# Patient Record
Sex: Female | Born: 1945 | Race: White | Hispanic: No | Marital: Married | Smoking: Never smoker
Health system: Southern US, Community
[De-identification: ages and names within clinical notes are randomized; demographics above are authoritative.]

## PROBLEM LIST (undated history)

## (undated) DIAGNOSIS — M545 Low back pain: Secondary | ICD-10-CM

## (undated) DIAGNOSIS — M519 Unspecified thoracic, thoracolumbar and lumbosacral intervertebral disc disorder: Secondary | ICD-10-CM

## (undated) DIAGNOSIS — S83209A Unspecified tear of unspecified meniscus, current injury, unspecified knee, initial encounter: Secondary | ICD-10-CM

## (undated) DIAGNOSIS — G629 Polyneuropathy, unspecified: Secondary | ICD-10-CM

## (undated) DIAGNOSIS — M5090 Cervical disc disorder, unspecified, unspecified cervical region: Secondary | ICD-10-CM

## (undated) DIAGNOSIS — M23209 Derangement of unspecified meniscus due to old tear or injury, unspecified knee: Secondary | ICD-10-CM

## (undated) DIAGNOSIS — I10 Essential (primary) hypertension: Secondary | ICD-10-CM

## (undated) DIAGNOSIS — G4700 Insomnia, unspecified: Secondary | ICD-10-CM

## (undated) DIAGNOSIS — E039 Hypothyroidism, unspecified: Secondary | ICD-10-CM

## (undated) DIAGNOSIS — G8929 Other chronic pain: Secondary | ICD-10-CM

## (undated) DIAGNOSIS — T7840XA Allergy, unspecified, initial encounter: Secondary | ICD-10-CM

## (undated) DIAGNOSIS — K921 Melena: Secondary | ICD-10-CM

## (undated) DIAGNOSIS — J984 Other disorders of lung: Secondary | ICD-10-CM

## (undated) DIAGNOSIS — M19071 Primary osteoarthritis, right ankle and foot: Secondary | ICD-10-CM

## (undated) DIAGNOSIS — K589 Irritable bowel syndrome without diarrhea: Secondary | ICD-10-CM

## (undated) DIAGNOSIS — E785 Hyperlipidemia, unspecified: Secondary | ICD-10-CM

## (undated) DIAGNOSIS — R7302 Impaired glucose tolerance (oral): Secondary | ICD-10-CM

## (undated) HISTORY — DX: Allergy, unspecified, initial encounter: T78.40XA

## (undated) HISTORY — DX: Insomnia, unspecified: G47.00

## (undated) HISTORY — DX: Hypothyroidism, unspecified: E03.9

## (undated) HISTORY — DX: Other disorders of lung: J98.4

## (undated) HISTORY — PX: HEMORRHOID SURGERY: SHX153

## (undated) HISTORY — DX: Unspecified thoracic, thoracolumbar and lumbosacral intervertebral disc disorder: M51.9

## (undated) HISTORY — DX: Essential (primary) hypertension: I10

## (undated) HISTORY — PX: CERVICAL LAMINECTOMY: SHX94

## (undated) HISTORY — DX: Derangement of unspecified meniscus due to old tear or injury, unspecified knee: M23.209

## (undated) HISTORY — DX: Unspecified tear of unspecified meniscus, current injury, unspecified knee, initial encounter: S83.209A

## (undated) HISTORY — DX: Primary osteoarthritis, right ankle and foot: M19.071

## (undated) HISTORY — DX: Irritable bowel syndrome without diarrhea: K58.9

## (undated) HISTORY — DX: Cervical disc disorder, unspecified, unspecified cervical region: M50.90

## (undated) HISTORY — DX: Hyperlipidemia, unspecified: E78.5

## (undated) HISTORY — DX: Other chronic pain: G89.29

## (undated) HISTORY — PX: CARPAL TUNNEL RELEASE: SHX101

## (undated) HISTORY — DX: Melena: K92.1

## (undated) HISTORY — DX: Polyneuropathy, unspecified: G62.9

## (undated) HISTORY — DX: Impaired glucose tolerance (oral): R73.02

## (undated) HISTORY — DX: Low back pain: M54.5

## (undated) HISTORY — PX: LUMBAR FUSION: SHX111

---
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
## 1965-05-07 HISTORY — PX: TONSILLECTOMY: SUR1361

## 1965-05-07 HISTORY — PX: CHOLECYSTECTOMY: SHX55

## 1972-05-07 HISTORY — PX: ABDOMINAL HYSTERECTOMY: SHX81

## 1997-08-12 ENCOUNTER — Inpatient Hospital Stay (HOSPITAL_COMMUNITY): Admission: RE | Admit: 1997-08-12 | Discharge: 1997-08-14 | Payer: Self-pay | Admitting: Neurological Surgery

## 1997-11-16 ENCOUNTER — Encounter: Admission: RE | Admit: 1997-11-16 | Discharge: 1998-02-14 | Payer: Self-pay | Admitting: Neurological Surgery

## 1998-05-04 ENCOUNTER — Ambulatory Visit (HOSPITAL_COMMUNITY): Admission: RE | Admit: 1998-05-04 | Discharge: 1998-05-04 | Payer: Self-pay | Admitting: Neurological Surgery

## 1998-05-04 ENCOUNTER — Encounter: Payer: Self-pay | Admitting: Neurological Surgery

## 1998-06-23 ENCOUNTER — Encounter: Admission: RE | Admit: 1998-06-23 | Discharge: 1998-08-09 | Payer: Self-pay | Admitting: Anesthesiology

## 1998-07-05 ENCOUNTER — Other Ambulatory Visit: Admission: RE | Admit: 1998-07-05 | Discharge: 1998-07-05 | Payer: Self-pay | Admitting: Gynecology

## 1998-08-03 ENCOUNTER — Encounter: Payer: Self-pay | Admitting: Neurological Surgery

## 1998-08-03 ENCOUNTER — Ambulatory Visit (HOSPITAL_COMMUNITY): Admission: RE | Admit: 1998-08-03 | Discharge: 1998-08-03 | Payer: Self-pay | Admitting: Neurological Surgery

## 1999-06-08 ENCOUNTER — Encounter: Payer: Self-pay | Admitting: Gynecology

## 1999-06-08 ENCOUNTER — Encounter: Admission: RE | Admit: 1999-06-08 | Discharge: 1999-06-08 | Payer: Self-pay | Admitting: Gynecology

## 1999-06-22 ENCOUNTER — Other Ambulatory Visit: Admission: RE | Admit: 1999-06-22 | Discharge: 1999-06-22 | Payer: Self-pay | Admitting: Gynecology

## 1999-07-06 ENCOUNTER — Encounter: Admission: RE | Admit: 1999-07-06 | Discharge: 1999-07-06 | Payer: Self-pay | Admitting: Gynecology

## 1999-07-06 ENCOUNTER — Encounter: Payer: Self-pay | Admitting: Gynecology

## 2000-06-27 ENCOUNTER — Encounter: Admission: RE | Admit: 2000-06-27 | Discharge: 2000-06-27 | Payer: Self-pay | Admitting: Gynecology

## 2000-06-27 ENCOUNTER — Encounter: Payer: Self-pay | Admitting: Gynecology

## 2000-07-04 ENCOUNTER — Other Ambulatory Visit: Admission: RE | Admit: 2000-07-04 | Discharge: 2000-07-04 | Payer: Self-pay | Admitting: Gynecology

## 2001-07-02 ENCOUNTER — Ambulatory Visit (HOSPITAL_COMMUNITY): Admission: RE | Admit: 2001-07-02 | Discharge: 2001-07-02 | Payer: Self-pay | Admitting: Emergency Medicine

## 2001-07-04 ENCOUNTER — Encounter: Admission: RE | Admit: 2001-07-04 | Discharge: 2001-07-04 | Payer: Self-pay | Admitting: Gynecology

## 2001-07-04 ENCOUNTER — Encounter: Payer: Self-pay | Admitting: Gynecology

## 2001-07-17 ENCOUNTER — Other Ambulatory Visit: Admission: RE | Admit: 2001-07-17 | Discharge: 2001-07-17 | Payer: Self-pay | Admitting: Gynecology

## 2001-09-04 ENCOUNTER — Ambulatory Visit (HOSPITAL_COMMUNITY): Admission: RE | Admit: 2001-09-04 | Discharge: 2001-09-04 | Payer: Self-pay | Admitting: Gastroenterology

## 2002-07-08 ENCOUNTER — Encounter: Payer: Self-pay | Admitting: Gynecology

## 2002-07-08 ENCOUNTER — Encounter: Admission: RE | Admit: 2002-07-08 | Discharge: 2002-07-08 | Payer: Self-pay | Admitting: Gynecology

## 2002-07-30 ENCOUNTER — Other Ambulatory Visit: Admission: RE | Admit: 2002-07-30 | Discharge: 2002-07-30 | Payer: Self-pay | Admitting: Gynecology

## 2002-07-31 ENCOUNTER — Ambulatory Visit (HOSPITAL_BASED_OUTPATIENT_CLINIC_OR_DEPARTMENT_OTHER): Admission: RE | Admit: 2002-07-31 | Discharge: 2002-07-31 | Payer: Self-pay | Admitting: Rheumatology

## 2003-07-09 ENCOUNTER — Encounter: Admission: RE | Admit: 2003-07-09 | Discharge: 2003-07-09 | Payer: Self-pay | Admitting: Gynecology

## 2003-08-05 ENCOUNTER — Other Ambulatory Visit: Admission: RE | Admit: 2003-08-05 | Discharge: 2003-08-05 | Payer: Self-pay | Admitting: Gynecology

## 2004-07-14 ENCOUNTER — Encounter: Admission: RE | Admit: 2004-07-14 | Discharge: 2004-07-14 | Payer: Self-pay | Admitting: Gynecology

## 2004-07-28 ENCOUNTER — Encounter: Admission: RE | Admit: 2004-07-28 | Discharge: 2004-07-28 | Payer: Self-pay | Admitting: Emergency Medicine

## 2004-08-08 ENCOUNTER — Encounter: Admission: RE | Admit: 2004-08-08 | Discharge: 2004-08-08 | Payer: Self-pay | Admitting: Emergency Medicine

## 2004-08-10 ENCOUNTER — Other Ambulatory Visit: Admission: RE | Admit: 2004-08-10 | Discharge: 2004-08-10 | Payer: Self-pay | Admitting: Gynecology

## 2005-07-19 ENCOUNTER — Encounter: Admission: RE | Admit: 2005-07-19 | Discharge: 2005-07-19 | Payer: Self-pay | Admitting: Gynecology

## 2006-07-25 ENCOUNTER — Encounter: Admission: RE | Admit: 2006-07-25 | Discharge: 2006-07-25 | Payer: Self-pay | Admitting: Gynecology

## 2006-08-05 ENCOUNTER — Encounter: Admission: RE | Admit: 2006-08-05 | Discharge: 2006-08-05 | Payer: Self-pay | Admitting: Emergency Medicine

## 2006-08-22 ENCOUNTER — Other Ambulatory Visit: Admission: RE | Admit: 2006-08-22 | Discharge: 2006-08-22 | Payer: Self-pay | Admitting: Gynecology

## 2007-05-13 ENCOUNTER — Ambulatory Visit (HOSPITAL_COMMUNITY): Admission: RE | Admit: 2007-05-13 | Discharge: 2007-05-13 | Payer: Self-pay | Admitting: Emergency Medicine

## 2007-05-13 ENCOUNTER — Encounter (INDEPENDENT_AMBULATORY_CARE_PROVIDER_SITE_OTHER): Payer: Self-pay | Admitting: Emergency Medicine

## 2007-08-14 ENCOUNTER — Encounter: Admission: RE | Admit: 2007-08-14 | Discharge: 2007-08-14 | Payer: Self-pay | Admitting: Gynecology

## 2008-08-18 ENCOUNTER — Encounter: Admission: RE | Admit: 2008-08-18 | Discharge: 2008-08-18 | Payer: Self-pay | Admitting: Gynecology

## 2009-05-03 ENCOUNTER — Encounter: Admission: RE | Admit: 2009-05-03 | Discharge: 2009-05-03 | Payer: Self-pay

## 2009-08-25 ENCOUNTER — Encounter: Admission: RE | Admit: 2009-08-25 | Discharge: 2009-08-25 | Payer: Self-pay | Admitting: Gynecology

## 2010-08-21 ENCOUNTER — Other Ambulatory Visit: Payer: Self-pay | Admitting: Gynecology

## 2010-08-21 DIAGNOSIS — Z1231 Encounter for screening mammogram for malignant neoplasm of breast: Secondary | ICD-10-CM

## 2010-08-29 ENCOUNTER — Ambulatory Visit
Admission: RE | Admit: 2010-08-29 | Discharge: 2010-08-29 | Disposition: A | Discharge status: Home / Self Care | Payer: Medicare Other | Source: Ambulatory Visit | Attending: Gynecology | Admitting: Gynecology

## 2010-08-29 DIAGNOSIS — Z1231 Encounter for screening mammogram for malignant neoplasm of breast: Secondary | ICD-10-CM

## 2010-09-22 NOTE — Procedures (Signed)
[place]  Patient:    Sheryl Copeland, Sheryl Copeland Visit Number: [id number] MRN: [id number]          Service Type: END Location: ENDO Attending Physician:  Charna Elizabeth Dictated by:   Anselmo Rod, M.D. Proc. Date: 09/04/01 Admit Date:  09/04/2001   CC:         Reuben Likes, M.D.   Procedure Report  DATE OF BIRTH:  08-19-2045  PROCEDURE PERFORMED:  Colonoscopy.  ENDOSCOPIST:  Anselmo Rod, M.D.  INSTRUMENT USED:  Olympus videocolonoscope.  INDICATIONS FOR PROCEDURE:  A 65 year old white female with a history of rectal bleeding, rule out colonic polyps, masses, hemorrhoids, etc.  PREPROCEDURE PREPARATION:  Informed consent was procured from the patient. The patient was fasted for eight hours prior to the procedure, and prepped with a bottle of magnesium of citrate and a gallon of NuLytely the night prior to the procedure.  PREPROCEDURE PHYSICAL:  VITAL SIGNS:  The patient had stable vital signs.  NECK:  Supple.  CHEST:  Clear to auscultation.  CARDIAC:  S1 and S2 is regular.  ABDOMEN:  Soft with normal bowel sounds.  DESCRIPTION OF THE PROCEDURE:   The patient was placed in the left lateral decubitus position and sedated with 75 mg of Demerol and 7 mg of Versed intravenously.  Once the patient was adequately sedated and maintained on low-flow oxygen and continuous cardiac monitoring, the Olympus videocolonoscope was advanced from the rectum to the cecum with difficulty. There was a large amount of residual stool in the colon.  Small internal hemorrhoids were appreciated on retroflexion in the rectum.  Multiple washes were done.  No masses, polyps, or diverticula were seen.  Small lesions could have been missed.  IMPRESSION: 1. Large amount of residual stool in the colon.  No masses, polyps, or    diverticula seen. 2. Small tablets seen in the cecum.  RECOMMENDATIONS: 1. High-fiber has been advised for the  patient. 2. Outpatient followup is advised in the next two weeks, further    recommendation to be made at that time. Dictated by:   Anselmo Rod, M.D. Attending Physician:  Charna Elizabeth DD:  09/04/01 TD:  09/06/01 Job: [id number]

## 2010-10-06 LAB — PULMONARY FUNCTION TEST

## 2011-01-06 DIAGNOSIS — S83209A Unspecified tear of unspecified meniscus, current injury, unspecified knee, initial encounter: Secondary | ICD-10-CM

## 2011-01-06 HISTORY — DX: Unspecified tear of unspecified meniscus, current injury, unspecified knee, initial encounter: S83.209A

## 2011-01-10 ENCOUNTER — Other Ambulatory Visit: Payer: Self-pay | Admitting: Emergency Medicine

## 2011-01-10 ENCOUNTER — Ambulatory Visit
Admission: RE | Admit: 2011-01-10 | Discharge: 2011-01-10 | Disposition: A | Discharge status: Home / Self Care | Payer: Medicare Other | Source: Ambulatory Visit | Attending: Emergency Medicine | Admitting: Emergency Medicine

## 2011-01-10 DIAGNOSIS — M25569 Pain in unspecified knee: Secondary | ICD-10-CM

## 2011-01-11 ENCOUNTER — Other Ambulatory Visit: Payer: Self-pay | Admitting: Emergency Medicine

## 2011-01-11 DIAGNOSIS — M25561 Pain in right knee: Secondary | ICD-10-CM

## 2011-01-13 ENCOUNTER — Ambulatory Visit
Admission: RE | Admit: 2011-01-13 | Discharge: 2011-01-13 | Disposition: A | Discharge status: Home / Self Care | Payer: Medicare Other | Source: Ambulatory Visit | Attending: Emergency Medicine | Admitting: Emergency Medicine

## 2011-01-13 DIAGNOSIS — M25561 Pain in right knee: Secondary | ICD-10-CM

## 2011-06-30 ENCOUNTER — Encounter: Payer: Self-pay | Admitting: Internal Medicine

## 2011-06-30 DIAGNOSIS — Z0001 Encounter for general adult medical examination with abnormal findings: Secondary | ICD-10-CM | POA: Insufficient documentation

## 2011-07-03 ENCOUNTER — Encounter: Payer: Self-pay | Admitting: Internal Medicine

## 2011-07-03 ENCOUNTER — Ambulatory Visit (INDEPENDENT_AMBULATORY_CARE_PROVIDER_SITE_OTHER): Payer: Medicare Other | Admitting: Internal Medicine

## 2011-07-03 ENCOUNTER — Other Ambulatory Visit (INDEPENDENT_AMBULATORY_CARE_PROVIDER_SITE_OTHER): Payer: Medicare Other

## 2011-07-03 VITALS — BP 132/80 | HR 56 | Temp 97.5°F | Ht 63.0 in | Wt 227.5 lb

## 2011-07-03 DIAGNOSIS — I10 Essential (primary) hypertension: Secondary | ICD-10-CM | POA: Insufficient documentation

## 2011-07-03 DIAGNOSIS — R7302 Impaired glucose tolerance (oral): Secondary | ICD-10-CM

## 2011-07-03 DIAGNOSIS — R7309 Other abnormal glucose: Secondary | ICD-10-CM

## 2011-07-03 DIAGNOSIS — M519 Unspecified thoracic, thoracolumbar and lumbosacral intervertebral disc disorder: Secondary | ICD-10-CM | POA: Insufficient documentation

## 2011-07-03 DIAGNOSIS — E785 Hyperlipidemia, unspecified: Secondary | ICD-10-CM

## 2011-07-03 DIAGNOSIS — Z0000 Encounter for general adult medical examination without abnormal findings: Secondary | ICD-10-CM

## 2011-07-03 DIAGNOSIS — E039 Hypothyroidism, unspecified: Secondary | ICD-10-CM | POA: Insufficient documentation

## 2011-07-03 DIAGNOSIS — K589 Irritable bowel syndrome without diarrhea: Secondary | ICD-10-CM

## 2011-07-03 DIAGNOSIS — S91102A Unspecified open wound of left great toe without damage to nail, initial encounter: Secondary | ICD-10-CM | POA: Insufficient documentation

## 2011-07-03 DIAGNOSIS — J4520 Mild intermittent asthma, uncomplicated: Secondary | ICD-10-CM | POA: Insufficient documentation

## 2011-07-03 DIAGNOSIS — R7303 Prediabetes: Secondary | ICD-10-CM | POA: Insufficient documentation

## 2011-07-03 DIAGNOSIS — G629 Polyneuropathy, unspecified: Secondary | ICD-10-CM | POA: Insufficient documentation

## 2011-07-03 DIAGNOSIS — T7840XA Allergy, unspecified, initial encounter: Secondary | ICD-10-CM | POA: Insufficient documentation

## 2011-07-03 DIAGNOSIS — M5090 Cervical disc disorder, unspecified, unspecified cervical region: Secondary | ICD-10-CM | POA: Insufficient documentation

## 2011-07-03 HISTORY — DX: Polyneuropathy, unspecified: G62.9

## 2011-07-03 HISTORY — DX: Irritable bowel syndrome, unspecified: K58.9

## 2011-07-03 HISTORY — DX: Impaired glucose tolerance (oral): R73.02

## 2011-07-03 LAB — HEMOGLOBIN A1C: Hgb A1c MFr Bld: 5.4 % (ref 4.6–6.5)

## 2011-07-03 LAB — CBC WITH DIFFERENTIAL/PLATELET
Eosinophils Relative: 6.4 % — ABNORMAL HIGH (ref 0.0–5.0)
Lymphocytes Relative: 29.4 % (ref 12.0–46.0)
Monocytes Absolute: 0.8 10*3/uL (ref 0.1–1.0)
Monocytes Relative: 9.8 % (ref 3.0–12.0)
Neutrophils Relative %: 53.8 % (ref 43.0–77.0)
Platelets: 187 10*3/uL (ref 150.0–400.0)
WBC: 8 10*3/uL (ref 4.5–10.5)

## 2011-07-03 LAB — URINALYSIS, ROUTINE W REFLEX MICROSCOPIC
Bilirubin Urine: NEGATIVE
Leukocytes, UA: NEGATIVE
Nitrite: NEGATIVE
Specific Gravity, Urine: 1.02 (ref 1.000–1.030)
pH: 6 (ref 5.0–8.0)

## 2011-07-03 LAB — BASIC METABOLIC PANEL
BUN: 18 mg/dL (ref 6–23)
Chloride: 101 mEq/L (ref 96–112)
Creatinine, Ser: 0.8 mg/dL (ref 0.4–1.2)
Glucose, Bld: 93 mg/dL (ref 70–99)
Potassium: 3.6 mEq/L (ref 3.5–5.1)

## 2011-07-03 LAB — HEPATIC FUNCTION PANEL
ALT: 22 U/L (ref 0–35)
AST: 26 U/L (ref 0–37)
Albumin: 4 g/dL (ref 3.5–5.2)
Total Protein: 7.2 g/dL (ref 6.0–8.3)

## 2011-07-03 LAB — LIPID PANEL
Cholesterol: 152 mg/dL (ref 0–200)
Triglycerides: 135 mg/dL (ref 0.0–149.0)

## 2011-07-03 LAB — TSH: TSH: 1.32 u[IU]/mL (ref 0.35–5.50)

## 2011-07-03 MED ORDER — POTASSIUM CHLORIDE ER 10 MEQ PO CPCR: 10.0000 meq | ORAL_CAPSULE | Freq: Every day | ORAL | Status: DC

## 2011-07-03 MED ORDER — NABUMETONE 500 MG PO TABS: 500.0000 mg | ORAL_TABLET | Freq: Two times a day (BID) | ORAL | Status: DC

## 2011-07-03 MED ORDER — ATENOLOL 50 MG PO TABS: 50.0000 mg | ORAL_TABLET | Freq: Every day | ORAL | Status: DC

## 2011-07-03 MED ORDER — QUINAPRIL HCL 40 MG PO TABS: 40.0000 mg | ORAL_TABLET | Freq: Every day | ORAL | Status: DC

## 2011-07-03 MED ORDER — INDAPAMIDE 2.5 MG PO TABS: 2.5000 mg | ORAL_TABLET | Freq: Every day | ORAL | Status: DC

## 2011-07-03 MED ORDER — TRAMADOL HCL 50 MG PO TABS: 50.0000 mg | ORAL_TABLET | Freq: Three times a day (TID) | ORAL | Status: DC | PRN

## 2011-07-03 MED ORDER — HYOSCYAMINE SULFATE CR 0.375 MG PO CP12: 0.3750 mg | ORAL_CAPSULE | Freq: Two times a day (BID) | ORAL | Status: DC | PRN

## 2011-07-03 NOTE — Assessment & Plan Note (Signed)
Overall doing well, age appropriate education and counseling updated, referrals for preventative services and immunizations addressed, dietary and smoking counseling addressed, most recent labs and ECG reviewed.  I have personally reviewed and have noted: 1) the patient's medical and social history 2) The pt's use of alcohol, tobacco, and illicit drugs 3) The patient's current medications and supplements 4) Functional ability including ADL's, fall risk, home safety risk, hearing and visual impairment 5) Diet and physical activities 6) Evidence for depression or mood disorder 7) The patient's height, weight, and BMI have been recorded in the chart I have made referrals, and provided counseling and education based on review of the above Declines colonscopy, immunization at this time; for labs

## 2011-07-03 NOTE — Patient Instructions (Addendum)
Continue all other medications as before You are given all the refills hardcopy today Please call if you change your mind about the colonoscopy referral, orthopedic (Dr Victorino Dike) for the toe wound Please go to LAB in the Basement for the blood and/or urine tests to be done today Please call the phone number [phone] (the PhoneTree System) for results of testing in 2-3 days;  When calling, simply dial the number, and when prompted enter the MRN number above (the Medical Record Number) and the # key, then the message should start. Please return in 1 year for your yearly visit, or sooner if needed, with Lab testing done 3-5 days before

## 2011-07-08 ENCOUNTER — Encounter: Payer: Self-pay | Admitting: Internal Medicine

## 2011-07-08 NOTE — Assessment & Plan Note (Signed)
stable overall by hx and exam, most recent data reviewed with pt, and pt to continue medical treatment as before, for a1c Lab Results  Component Value Date   HGBA1C 5.4 07/03/2011

## 2011-07-08 NOTE — Progress Notes (Signed)
Subjective:    Patient ID: Sheryl Copeland, female    DOB: 06-03-1945, 66 y.o.   MRN: [id number]  HPI  Here for wellness as new pt;  Overall doing ok;  Pt denies CP, worsening SOB, DOE, wheezing, orthopnea, PND, worsening LE edema, palpitations, dizziness or syncope.  Pt denies neurological change such as new Headache, facial or extremity weakness.  Pt denies polydipsia, polyuria, or low sugar symptoms. Pt states overall good compliance with treatment and medications, good tolerability, and trying to follow lower cholesterol diet.  Pt denies worsening depressive symptoms, suicidal ideation or panic. No fever, wt loss, night sweats, loss of appetite, or other constitutional symptoms.  Pt states good ability with ADL's, low fall risk, home safety reviewed and adequate, no significant changes in hearing or vision, and occasionally active with exercise. Needs med refills soon. No willing to consider colonoscopy or toe wound f/u referrals at this time Past Medical History  Diagnosis Date  . Acute meniscal tear of knee sept 2012    right knee  . Arthritis of foot, right     first mtp  . Blood in stool   . Migraine   . Cervical disc disease     s/p cervical spine surgury 1990  . Lumbar disc disease     s/p lumbar fusion 1999  . Allergy   . Asthma   . Hypertension   . Hypothyroidism   . IBS (irritable bowel syndrome) 07/03/2011  . Peripheral neuropathy 07/03/2011  . Impaired glucose tolerance 07/03/2011   Past Surgical History  Procedure Date  . Lumbar fusion   . Cholecystectomy 1967  . Appendectomy 1962  . Tonsillectomy 1967  . Abdominal hysterectomy 1974    reports that she has never smoked. She does not have any smokeless tobacco history on file. She reports that she does not drink alcohol or use illicit drugs. family history includes Alcohol abuse in her others; Arthritis in her others; Cancer in her others; Diabetes in her other; Heart disease in her other; Hyperlipidemia in her  other; Hypertension in her others; Mental illness in her other; and Stroke in her other. Allergies  Allergen Reactions  . Celebrex (Celecoxib)   . Iodine    No current outpatient prescriptions on file prior to visit.   Review of Systems Review of Systems  Constitutional: Negative for diaphoresis, activity change, appetite change and unexpected weight change.  HENT: Negative for hearing loss, ear pain, facial swelling, mouth sores and neck stiffness.   Eyes: Negative for pain, redness and visual disturbance.  Respiratory: Negative for shortness of breath and wheezing.   Cardiovascular: Negative for chest pain and palpitations.  Gastrointestinal: Negative for diarrhea, blood in stool, abdominal distention and rectal pain.  Genitourinary: Negative for hematuria, flank pain and decreased urine volume.  Musculoskeletal: Negative for myalgias and joint swelling.  Skin: Negative for color change and wound.  Neurological: Negative for syncope and numbness.  Hematological: Negative for adenopathy.  Psychiatric/Behavioral: Negative for hallucinations, self-injury, decreased concentration and agitation.      Objective:   Physical Exam BP 132/80  Pulse 56  Temp(Src) 97.5 F (36.4 C) (Oral)  Ht 5\' 3"  (1.6 m)  Wt 227 lb 8 oz (103.193 kg)  BMI 40.30 kg/m2  SpO2 95% Physical Exam  VS noted Constitutional: Pt is oriented to person, place, and time. Appears well-developed and well-nourished.  HENT:  Head: Normocephalic and atraumatic.  Right Ear: External ear normal.  Left Ear: External ear normal.  Nose: Nose normal.  Mouth/Throat: Oropharynx is clear and moist.  Eyes: Conjunctivae and EOM are normal. Pupils are equal, round, and reactive to light.  Neck: Normal range of motion. Neck supple. No JVD present. No tracheal deviation present.  Cardiovascular: Normal rate, regular rhythm, normal heart sounds and intact distal pulses.   Pulmonary/Chest: Effort normal and breath sounds normal.    Abdominal: Soft. Bowel sounds are normal. There is no tenderness.  Musculoskeletal: Normal range of motion. Exhibits no edema.  Lymphadenopathy:  Has no cervical adenopathy.  Neurological: Pt is alert and oriented to person, place, and time. Pt has normal reflexes. No cranial nerve deficit.  Skin: Skin is warm and dry. No rash noted. left toe wound not examined today Psychiatric:  Has  normal mood and affect. Behavior is normal.     Assessment & Plan:

## 2011-07-15 ENCOUNTER — Encounter: Payer: Self-pay | Admitting: Internal Medicine

## 2011-07-15 DIAGNOSIS — J984 Other disorders of lung: Secondary | ICD-10-CM | POA: Insufficient documentation

## 2011-07-15 DIAGNOSIS — M545 Low back pain, unspecified: Secondary | ICD-10-CM | POA: Insufficient documentation

## 2011-07-15 DIAGNOSIS — M23209 Derangement of unspecified meniscus due to old tear or injury, unspecified knee: Secondary | ICD-10-CM

## 2011-07-15 DIAGNOSIS — G43909 Migraine, unspecified, not intractable, without status migrainosus: Secondary | ICD-10-CM | POA: Insufficient documentation

## 2011-07-15 DIAGNOSIS — G4700 Insomnia, unspecified: Secondary | ICD-10-CM

## 2011-07-15 DIAGNOSIS — G8929 Other chronic pain: Secondary | ICD-10-CM

## 2011-07-15 DIAGNOSIS — E785 Hyperlipidemia, unspecified: Secondary | ICD-10-CM

## 2011-07-15 HISTORY — DX: Hyperlipidemia, unspecified: E78.5

## 2011-07-15 HISTORY — DX: Other disorders of lung: J98.4

## 2011-07-15 HISTORY — DX: Derangement of unspecified meniscus due to old tear or injury, unspecified knee: M23.209

## 2011-07-15 HISTORY — DX: Insomnia, unspecified: G47.00

## 2011-07-15 HISTORY — DX: Low back pain, unspecified: M54.50

## 2011-09-21 ENCOUNTER — Other Ambulatory Visit: Payer: Self-pay | Admitting: Internal Medicine

## 2011-09-21 NOTE — Telephone Encounter (Signed)
Routine to robin 

## 2011-09-21 NOTE — Telephone Encounter (Signed)
Pt requesting thyroid med prescription/armour thyroid--[facility] [place]

## 2011-09-21 NOTE — Telephone Encounter (Signed)
Called the patient left message to call back 

## 2011-09-21 NOTE — Telephone Encounter (Signed)
Ok to refill, but I cannot find on the current MAR or in centricity as well  Please ask pt current dose, or contact pharmacy

## 2011-09-21 NOTE — Telephone Encounter (Signed)
Medication is not on list please advise on strength

## 2011-09-24 NOTE — Telephone Encounter (Signed)
Called the patient and she informed she is taking 120 mg of armour thyroid, will send to pharmacy

## 2011-09-26 ENCOUNTER — Other Ambulatory Visit: Payer: Self-pay | Admitting: Gynecology

## 2011-09-26 DIAGNOSIS — Z1231 Encounter for screening mammogram for malignant neoplasm of breast: Secondary | ICD-10-CM

## 2011-09-26 MED ORDER — THYROID 120 MG PO TABS: 120.0000 mg | ORAL_TABLET | Freq: Every day | ORAL | Status: DC

## 2011-10-04 ENCOUNTER — Ambulatory Visit
Admission: RE | Admit: 2011-10-04 | Discharge: 2011-10-04 | Disposition: A | Discharge status: Home / Self Care | Payer: Medicare Other | Source: Ambulatory Visit | Attending: Gynecology | Admitting: Gynecology

## 2011-10-04 DIAGNOSIS — Z1231 Encounter for screening mammogram for malignant neoplasm of breast: Secondary | ICD-10-CM

## 2012-05-07 HISTORY — PX: BREAST BIOPSY: SHX20

## 2012-07-03 ENCOUNTER — Ambulatory Visit (INDEPENDENT_AMBULATORY_CARE_PROVIDER_SITE_OTHER): Payer: Medicare Other | Admitting: Internal Medicine

## 2012-07-03 ENCOUNTER — Encounter: Payer: Self-pay | Admitting: Internal Medicine

## 2012-07-03 ENCOUNTER — Other Ambulatory Visit (INDEPENDENT_AMBULATORY_CARE_PROVIDER_SITE_OTHER): Payer: Medicare Other

## 2012-07-03 VITALS — BP 102/68 | HR 72 | Temp 98.0°F | Ht 63.0 in | Wt 214.5 lb

## 2012-07-03 DIAGNOSIS — Z0000 Encounter for general adult medical examination without abnormal findings: Secondary | ICD-10-CM

## 2012-07-03 DIAGNOSIS — J019 Acute sinusitis, unspecified: Secondary | ICD-10-CM

## 2012-07-03 DIAGNOSIS — R7302 Impaired glucose tolerance (oral): Secondary | ICD-10-CM

## 2012-07-03 DIAGNOSIS — Z23 Encounter for immunization: Secondary | ICD-10-CM

## 2012-07-03 DIAGNOSIS — R7309 Other abnormal glucose: Secondary | ICD-10-CM

## 2012-07-03 LAB — CBC WITH DIFFERENTIAL/PLATELET
Basophils Absolute: 0.1 10*3/uL (ref 0.0–0.1)
Eosinophils Absolute: 0.3 10*3/uL (ref 0.0–0.7)
HCT: 39.6 % (ref 36.0–46.0)
Hemoglobin: 13.7 g/dL (ref 12.0–15.0)
Lymphs Abs: 2.1 10*3/uL (ref 0.7–4.0)
MCHC: 34.6 g/dL (ref 30.0–36.0)
MCV: 89.5 fl (ref 78.0–100.0)
Neutro Abs: 7.2 10*3/uL (ref 1.4–7.7)
RDW: 12.3 % (ref 11.5–14.6)

## 2012-07-03 LAB — LIPID PANEL
HDL: 32.4 mg/dL — ABNORMAL LOW (ref >39.00)
Total CHOL/HDL Ratio: 4

## 2012-07-03 LAB — HEPATIC FUNCTION PANEL
Alkaline Phosphatase: 48 U/L (ref 39–117)
Bilirubin, Direct: 0.1 mg/dL (ref 0.0–0.3)
Total Bilirubin: 0.9 mg/dL (ref 0.3–1.2)

## 2012-07-03 LAB — URINALYSIS, ROUTINE W REFLEX MICROSCOPIC
Ketones, ur: NEGATIVE
Total Protein, Urine: NEGATIVE
Urine Glucose: NEGATIVE
Urobilinogen, UA: 0.2 (ref 0.0–1.0)

## 2012-07-03 LAB — HEMOGLOBIN A1C: Hgb A1c MFr Bld: 5.4 % (ref 4.6–6.5)

## 2012-07-03 LAB — BASIC METABOLIC PANEL
CO2: 30 mEq/L (ref 19–32)
Calcium: 9.4 mg/dL (ref 8.4–10.5)
GFR: 59.35 mL/min — ABNORMAL LOW (ref >60.00)
Sodium: 137 mEq/L (ref 135–145)

## 2012-07-03 MED ORDER — QUINAPRIL HCL 40 MG PO TABS: 40.0000 mg | ORAL_TABLET | Freq: Every day | ORAL | Status: DC

## 2012-07-03 MED ORDER — ATENOLOL 50 MG PO TABS: 50.0000 mg | ORAL_TABLET | Freq: Every day | ORAL | Status: DC

## 2012-07-03 MED ORDER — LEVOFLOXACIN 250 MG PO TABS: 250.0000 mg | ORAL_TABLET | Freq: Every day | ORAL | Status: DC

## 2012-07-03 MED ORDER — POTASSIUM CHLORIDE ER 10 MEQ PO CPCR: 10.0000 meq | ORAL_CAPSULE | Freq: Every day | ORAL | Status: DC

## 2012-07-03 MED ORDER — THYROID 120 MG PO TABS: 120.0000 mg | ORAL_TABLET | Freq: Every day | ORAL | Status: DC

## 2012-07-03 NOTE — Assessment & Plan Note (Signed)
stable overall by history and exam, recent data reviewed with pt, and pt to continue medical treatment as before,  to f/u any worsening symptoms or concerns Lab Results  Component Value Date   HGBA1C 5.4 07/03/2012

## 2012-07-03 NOTE — Patient Instructions (Addendum)
Please take all new medication as prescribed - the antibiotic Please continue all other medications as before, and refills have been done if requested. Please have the pharmacy call with any other refills you may need. You had the pneumonia shot today You will be contacted regarding the referral for: colonoscopy Please go to the LAB in the Basement (turn left off the elevator) for the tests to be done today You will be contacted by phone if any changes need to be made immediately.  Otherwise, you will receive a letter about your results with an explanation, but please check with MyChart first. Thank you for enrolling in MyChart. Please follow the instructions below to securely access your online medical record. MyChart allows you to send messages to your doctor, view your test results, renew your prescriptions, schedule appointments, and more. To Log into My Chart online, please go by [employer] or [employer] to [email or web address], or download the MyChart App from the [employer] .  Your Username is: [email or web address]  (password [email or web address]) Please send a practice Message on Mychart later today. Please return in 1 year for your yearly visit, or sooner if needed

## 2012-07-03 NOTE — Assessment & Plan Note (Signed)

## 2012-07-03 NOTE — Progress Notes (Signed)
Subjective:    Patient ID: Sheryl Copeland, female    DOB: Sep 23, 1945, 67 y.o.   MRN: [id number]  HPI  Here for wellness and f/u;  Overall doing ok;  Pt denies CP, worsening SOB, DOE, wheezing, orthopnea, PND, worsening LE edema, palpitations, dizziness or syncope.  Pt denies neurological change such as new headache, facial or extremity weakness.  Pt denies polydipsia, polyuria, or low sugar symptoms. Pt states overall good compliance with treatment and medications, good tolerability, and has been trying to follow lower cholesterol diet.  Pt denies worsening depressive symptoms, suicidal ideation or panic. No fever, night sweats, wt loss, loss of appetite, or other constitutional symptoms.  Pt states good ability with ADL's, has low fall risk, home safety reviewed and adequate, no other significant changes in hearing or vision, and only occasionally active with exercise.  Incidentally -  Here with 2-3 days acute onset fever, facial pain, pressure, headache, general weakness and malaise, and greenish d/c, with mild ST. Past Medical History  Diagnosis Date  . Acute meniscal tear of knee sept 2012    right knee  . Arthritis of foot, right     first mtp  . Blood in stool   . Migraine   . Cervical disc disease     s/p cervical spine surgury 1990  . Lumbar disc disease     s/p lumbar fusion 1999  . Allergy   . Asthma   . Hypertension   . Hypothyroidism   . IBS (irritable bowel syndrome) 07/03/2011  . Peripheral neuropathy 07/03/2011  . Impaired glucose tolerance 07/03/2011  . 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 Insomnia 07/15/2011  . Chronic low back pain 07/15/2011  . Hyperlipidemia 07/15/2011  . Restrictive lung disease 07/15/2011   Past Surgical History  Procedure Laterality Date  . Lumbar fusion    . Cholecystectomy  1967  . Appendectomy  1962  . Tonsillectomy  1967  . Abdominal hysterectomy  1974  . Carpal tunnel release      x 2  . Cervical laminectomy    .  Hemorrhoid surgery      reports that she has never smoked. She does not have any smokeless tobacco history on file. She reports that she does not drink alcohol or use illicit drugs. family history includes Alcohol abuse in her others; Arthritis in her others; Cancer in her others; Diabetes in her other; Heart disease in her other; Hyperlipidemia in her other; Hypertension in her others; Mental illness in her other; and Stroke in her other. Allergies  Allergen Reactions  . Aspirin   . Celebrex (Celecoxib)   . Dipyridamole   . Eggs Or Egg-Derived Products   . Erythromycin Hives  . Gabapentin   . Iodine   . Penicillins Diarrhea   Current Outpatient Prescriptions on File Prior to Visit  Medication Sig Dispense Refill  . Cholecalciferol (VITAMIN D3) 1000 UNITS CAPS Take by mouth daily.      . hyoscyamine (LEVSINEX) 0.375 MG 12 hr capsule Take 1 capsule (0.375 mg total) by mouth 2 (two) times daily as needed.  180 capsule  3  . indapamide (LOZOL) 2.5 MG tablet Take 1 tablet (2.5 mg total) by mouth daily.  90 tablet  3  . nabumetone (RELAFEN) 500 MG tablet Take 1 tablet (500 mg total) by mouth 2 (two) times daily.  180 tablet  3  . traMADol (ULTRAM) 50 MG tablet Take 1 tablet (50 mg total) by mouth every 8 (eight) hours  as needed.  270 tablet  1   No current facility-administered medications on file prior to visit.   Review of Systems Constitutional: Negative for diaphoresis, activity change, appetite change or unexpected weight change.  HENT: Negative for hearing loss, ear pain, facial swelling, mouth sores and neck stiffness.   Eyes: Negative for pain, redness and visual disturbance.  Respiratory: Negative for shortness of breath and wheezing.   Cardiovascular: Negative for chest pain and palpitations.  Gastrointestinal: Negative for diarrhea, blood in stool, abdominal distention or other pain Genitourinary: Negative for hematuria, flank pain or change in urine volume.  Musculoskeletal:  Negative for myalgias and joint swelling.  Skin: Negative for color change and wound.  Neurological: Negative for syncope and numbness. other than noted Hematological: Negative for adenopathy.  Psychiatric/Behavioral: Negative for hallucinations, self-injury, decreased concentration and agitation.      Objective:   Physical Exam BP 102/68  Pulse 72  Temp(Src) 98 F (36.7 C) (Oral)  Ht 5\' 3"  (1.6 m)  Wt 214 lb 8 oz (97.297 kg)  BMI 38.01 kg/m2  SpO2 96% VS noted, mild ill Constitutional: Pt is oriented to person, place, and time. Appears well-developed and well-nourished.  Head: Normocephalic and atraumatic.  Right Ear: External ear normal.  Left Ear: External ear normal.  Nose: Nose normal.  Eyes: Conjunctivae and EOM are normal. Pupils are equal, round, and reactive to light.  Bilat tm's with mild erythema.  Max sinus areas mild tender.  Pharynx with mild erythema, no exudate Neck: Normal range of motion. Neck supple. No JVD present. No tracheal deviation present.  Cardiovascular: Normal rate, regular rhythm, normal heart sounds and intact distal pulses.   Pulmonary/Chest: Effort normal and breath sounds normal.  Abdominal: Soft. Bowel sounds are normal. There is no tenderness. No HSM  Musculoskeletal: Normal range of motion. Exhibits no edema.  Lymphadenopathy:  Has no cervical adenopathy.  Neurological: Pt is alert and oriented to person, place, and time. Pt has normal reflexes. No cranial nerve deficit.  Skin: Skin is warm and dry. No rash noted.  Psychiatric:  Has  normal mood and affect. Behavior is normal.         Assessment & Plan:

## 2012-07-03 NOTE — Assessment & Plan Note (Signed)
Mild to mod, for antibx course,  to f/u any worsening symptoms or concerns 

## 2012-07-07 ENCOUNTER — Other Ambulatory Visit: Payer: Self-pay | Admitting: Internal Medicine

## 2012-07-07 ENCOUNTER — Encounter: Payer: Self-pay | Admitting: Internal Medicine

## 2012-07-07 MED ORDER — CEPHALEXIN 500 MG PO CAPS: 500.0000 mg | ORAL_CAPSULE | Freq: Four times a day (QID) | ORAL | Status: DC

## 2012-07-16 ENCOUNTER — Encounter: Payer: Self-pay | Admitting: Internal Medicine

## 2012-07-22 ENCOUNTER — Encounter: Payer: Self-pay | Admitting: Internal Medicine

## 2012-08-05 ENCOUNTER — Other Ambulatory Visit: Payer: Self-pay

## 2012-08-05 MED ORDER — INDAPAMIDE 2.5 MG PO TABS: 2.5000 mg | ORAL_TABLET | Freq: Every day | ORAL | Status: DC

## 2012-08-05 MED ORDER — THYROID 120 MG PO TABS: 120.0000 mg | ORAL_TABLET | Freq: Every day | ORAL | Status: DC

## 2012-08-05 MED ORDER — POTASSIUM CHLORIDE ER 10 MEQ PO CPCR: 10.0000 meq | ORAL_CAPSULE | Freq: Every day | ORAL | Status: DC

## 2012-08-05 MED ORDER — ATENOLOL 50 MG PO TABS: 50.0000 mg | ORAL_TABLET | Freq: Every day | ORAL | Status: DC

## 2012-08-05 MED ORDER — QUINAPRIL HCL 40 MG PO TABS: 40.0000 mg | ORAL_TABLET | Freq: Every day | ORAL | Status: DC

## 2012-08-18 ENCOUNTER — Ambulatory Visit (AMBULATORY_SURGERY_CENTER): Payer: Medicare Other | Admitting: *Deleted

## 2012-08-18 VITALS — Ht 63.0 in | Wt 217.4 lb

## 2012-08-18 DIAGNOSIS — Z1211 Encounter for screening for malignant neoplasm of colon: Secondary | ICD-10-CM

## 2012-08-18 MED ORDER — MOVIPREP 100 G PO SOLR: ORAL | Status: DC

## 2012-08-19 ENCOUNTER — Encounter: Payer: Self-pay | Admitting: Internal Medicine

## 2012-09-01 ENCOUNTER — Ambulatory Visit (AMBULATORY_SURGERY_CENTER): Payer: Medicare Other | Admitting: Internal Medicine

## 2012-09-01 ENCOUNTER — Encounter: Payer: Self-pay | Admitting: Internal Medicine

## 2012-09-01 VITALS — BP 124/54 | HR 60 | Temp 98.0°F | Resp 20 | Ht 63.0 in | Wt 217.0 lb

## 2012-09-01 DIAGNOSIS — Z1211 Encounter for screening for malignant neoplasm of colon: Secondary | ICD-10-CM

## 2012-09-01 MED ORDER — SODIUM CHLORIDE 0.9 % IV SOLN: 500.0000 mL | INTRAVENOUS | Status: DC

## 2012-09-01 NOTE — Progress Notes (Signed)
Patient did not experience any of the following events: a burn prior to discharge; a fall within the facility; wrong site/side/patient/procedure/implant event; or a hospital transfer or hospital admission upon discharge from the facility. (G8907) Patient did not have preoperative order for IV antibiotic SSI prophylaxis. (G8918)  

## 2012-09-01 NOTE — Op Note (Signed)
[facility] [address] [place], [address]   COLONOSCOPY PROCEDURE REPORT  PATIENT: Sheryl Copeland, Sheryl Copeland  MR#: [id number] BIRTHDATE: [birth year] , 66  yrs. old GENDER: Female ENDOSCOPIST: Roxy Cedar, MD REFERRED XB:JYNWG Amani Nodarse, M.D. PROCEDURE DATE:  09/01/2012 PROCEDURE:   Colonoscopy, screening ASA CLASS:   Class II INDICATIONS:Average risk patient for colon cancer.   Negative exam with Dr Loreta Ave 11 yrs ago (per pt) MEDICATIONS: MAC sedation, administered by CRNA and propofol (Diprivan) 200mg  IV  DESCRIPTION OF PROCEDURE:   After the risks benefits and alternatives of the procedure were thoroughly explained, informed consent was obtained.  A digital rectal exam revealed no abnormalities of the rectum.   The LB [id number]-H180AL [id number] and LB CF-Q180AL [id number]  endoscope was introduced through the anus and advanced to the cecum, which was identified by both the appendix and ileocecal valve. No adverse events experienced.   The quality of the prep was excellent, using MoviPrep  The instrument was then slowly withdrawn as the colon was fully examined.      COLON FINDINGS: Mild diverticulosis was noted in the sigmoid colon. The colon was otherwise normal.  There was no  inflammation, polyps or cancers.  Retroflexed views revealed no abnormalities. The time to cecum=3 minutes 22 seconds.  Withdrawal time=6 minutes 28 seconds.  The scope was withdrawn and the procedure completed. COMPLICATIONS: There were no complications.  ENDOSCOPIC IMPRESSION: 1.   Mild diverticulosis was noted in the sigmoid colon 2.   The colon was otherwise normal  RECOMMENDATIONS: 1. Continue current colorectal screening recommendations for "routine risk" patients with a repeat colonoscopy in 10 years.   eSigned:  Roxy Cedar, MD 09/01/2012 11:28 AM   cc: The Patient and Corwin Levins, MD

## 2012-09-01 NOTE — Patient Instructions (Addendum)

## 2012-09-02 ENCOUNTER — Telehealth: Payer: Self-pay | Admitting: *Deleted

## 2012-09-02 NOTE — Telephone Encounter (Signed)
  Follow up Call-  Call back number 09/01/2012  Post procedure Call Back phone  # [phone]  Permission to leave phone message Yes     Patient questions:  Do you have a fever, pain , or abdominal swelling? no Pain Score  0 *  Have you tolerated food without any problems? yes  Have you been able to return to your normal activities? yes  Do you have any questions about your discharge instructions: Diet   no Medications  no Follow up visit  no  Do you have questions or concerns about your Care? no  Actions: * If pain score is 4 or above: No action needed, pain <4.  Pt c/o cramping and gas last night but says she does not have any discomfort right now. Advised to drink something warm this am, to help relieve and residual air today

## 2012-11-28 ENCOUNTER — Other Ambulatory Visit: Payer: Self-pay

## 2012-11-28 DIAGNOSIS — Z1231 Encounter for screening mammogram for malignant neoplasm of breast: Secondary | ICD-10-CM

## 2012-12-08 ENCOUNTER — Ambulatory Visit
Admission: RE | Admit: 2012-12-08 | Discharge: 2012-12-08 | Disposition: A | Discharge status: Home / Self Care | Payer: Medicare Other | Source: Ambulatory Visit

## 2012-12-08 ENCOUNTER — Other Ambulatory Visit: Payer: Self-pay | Admitting: Internal Medicine

## 2012-12-08 DIAGNOSIS — Z1231 Encounter for screening mammogram for malignant neoplasm of breast: Secondary | ICD-10-CM

## 2012-12-09 ENCOUNTER — Other Ambulatory Visit: Payer: Self-pay | Admitting: Gynecology

## 2012-12-09 DIAGNOSIS — R928 Other abnormal and inconclusive findings on diagnostic imaging of breast: Secondary | ICD-10-CM

## 2012-12-25 ENCOUNTER — Ambulatory Visit
Admission: RE | Admit: 2012-12-25 | Discharge: 2012-12-25 | Disposition: A | Discharge status: Home / Self Care | Payer: Medicare Other | Source: Ambulatory Visit | Attending: Gynecology | Admitting: Gynecology

## 2012-12-25 ENCOUNTER — Other Ambulatory Visit: Payer: Self-pay | Admitting: Gynecology

## 2012-12-25 DIAGNOSIS — R921 Mammographic calcification found on diagnostic imaging of breast: Secondary | ICD-10-CM

## 2012-12-25 DIAGNOSIS — R928 Other abnormal and inconclusive findings on diagnostic imaging of breast: Secondary | ICD-10-CM

## 2013-01-19 ENCOUNTER — Ambulatory Visit
Admission: RE | Admit: 2013-01-19 | Discharge: 2013-01-19 | Disposition: A | Discharge status: Home / Self Care | Payer: Medicare Other | Source: Ambulatory Visit | Attending: Gynecology | Admitting: Gynecology

## 2013-01-19 DIAGNOSIS — R921 Mammographic calcification found on diagnostic imaging of breast: Secondary | ICD-10-CM

## 2013-01-21 LAB — HM MAMMOGRAPHY

## 2013-03-12 ENCOUNTER — Other Ambulatory Visit: Payer: Self-pay

## 2013-06-09 ENCOUNTER — Inpatient Hospital Stay (HOSPITAL_COMMUNITY)
Admission: EM | Admit: 2013-06-09 | Discharge: 2013-06-14 | DRG: 603 | Disposition: A | Discharge status: Home Health | Payer: Medicare HMO | Attending: Internal Medicine | Admitting: Internal Medicine

## 2013-06-09 ENCOUNTER — Encounter: Payer: Self-pay | Admitting: Internal Medicine

## 2013-06-09 ENCOUNTER — Emergency Department (HOSPITAL_COMMUNITY): Payer: Medicare HMO

## 2013-06-09 ENCOUNTER — Encounter (HOSPITAL_COMMUNITY): Payer: Self-pay | Admitting: Emergency Medicine

## 2013-06-09 DIAGNOSIS — M1710 Unilateral primary osteoarthritis, unspecified knee: Secondary | ICD-10-CM | POA: Diagnosis present

## 2013-06-09 DIAGNOSIS — S91109A Unspecified open wound of unspecified toe(s) without damage to nail, initial encounter: Secondary | ICD-10-CM

## 2013-06-09 DIAGNOSIS — Z79899 Other long term (current) drug therapy: Secondary | ICD-10-CM

## 2013-06-09 DIAGNOSIS — T7840XA Allergy, unspecified, initial encounter: Secondary | ICD-10-CM

## 2013-06-09 DIAGNOSIS — M23209 Derangement of unspecified meniscus due to old tear or injury, unspecified knee: Secondary | ICD-10-CM

## 2013-06-09 DIAGNOSIS — L0390 Cellulitis, unspecified: Secondary | ICD-10-CM

## 2013-06-09 DIAGNOSIS — M109 Gout, unspecified: Secondary | ICD-10-CM

## 2013-06-09 DIAGNOSIS — K589 Irritable bowel syndrome without diarrhea: Secondary | ICD-10-CM

## 2013-06-09 DIAGNOSIS — Z981 Arthrodesis status: Secondary | ICD-10-CM

## 2013-06-09 DIAGNOSIS — Z886 Allergy status to analgesic agent status: Secondary | ICD-10-CM | POA: Diagnosis not present

## 2013-06-09 DIAGNOSIS — E785 Hyperlipidemia, unspecified: Secondary | ICD-10-CM | POA: Diagnosis present

## 2013-06-09 DIAGNOSIS — Z0000 Encounter for general adult medical examination without abnormal findings: Secondary | ICD-10-CM

## 2013-06-09 DIAGNOSIS — L02619 Cutaneous abscess of unspecified foot: Principal | ICD-10-CM | POA: Diagnosis present

## 2013-06-09 DIAGNOSIS — S91102A Unspecified open wound of left great toe without damage to nail, initial encounter: Secondary | ICD-10-CM | POA: Diagnosis present

## 2013-06-09 DIAGNOSIS — I10 Essential (primary) hypertension: Secondary | ICD-10-CM | POA: Diagnosis present

## 2013-06-09 DIAGNOSIS — M545 Low back pain, unspecified: Secondary | ICD-10-CM | POA: Diagnosis present

## 2013-06-09 DIAGNOSIS — G4700 Insomnia, unspecified: Secondary | ICD-10-CM

## 2013-06-09 DIAGNOSIS — J984 Other disorders of lung: Secondary | ICD-10-CM

## 2013-06-09 DIAGNOSIS — R509 Fever, unspecified: Secondary | ICD-10-CM | POA: Diagnosis not present

## 2013-06-09 DIAGNOSIS — G609 Hereditary and idiopathic neuropathy, unspecified: Secondary | ICD-10-CM | POA: Diagnosis present

## 2013-06-09 DIAGNOSIS — E039 Hypothyroidism, unspecified: Secondary | ICD-10-CM | POA: Diagnosis present

## 2013-06-09 DIAGNOSIS — J45909 Unspecified asthma, uncomplicated: Secondary | ICD-10-CM | POA: Diagnosis present

## 2013-06-09 DIAGNOSIS — L02612 Cutaneous abscess of left foot: Secondary | ICD-10-CM | POA: Diagnosis present

## 2013-06-09 DIAGNOSIS — G629 Polyneuropathy, unspecified: Secondary | ICD-10-CM | POA: Diagnosis present

## 2013-06-09 DIAGNOSIS — G8929 Other chronic pain: Secondary | ICD-10-CM | POA: Diagnosis present

## 2013-06-09 DIAGNOSIS — M6240 Contracture of muscle, unspecified site: Secondary | ICD-10-CM | POA: Diagnosis present

## 2013-06-09 DIAGNOSIS — M2020 Hallux rigidus, unspecified foot: Secondary | ICD-10-CM | POA: Diagnosis present

## 2013-06-09 DIAGNOSIS — L03119 Cellulitis of unspecified part of limb: Secondary | ICD-10-CM

## 2013-06-09 DIAGNOSIS — J019 Acute sinusitis, unspecified: Secondary | ICD-10-CM

## 2013-06-09 DIAGNOSIS — L03039 Cellulitis of unspecified toe: Secondary | ICD-10-CM | POA: Diagnosis not present

## 2013-06-09 DIAGNOSIS — M25561 Pain in right knee: Secondary | ICD-10-CM

## 2013-06-09 DIAGNOSIS — M519 Unspecified thoracic, thoracolumbar and lumbosacral intervertebral disc disorder: Secondary | ICD-10-CM

## 2013-06-09 DIAGNOSIS — L03032 Cellulitis of left toe: Secondary | ICD-10-CM

## 2013-06-09 DIAGNOSIS — D72829 Elevated white blood cell count, unspecified: Secondary | ICD-10-CM

## 2013-06-09 DIAGNOSIS — M5090 Cervical disc disorder, unspecified, unspecified cervical region: Secondary | ICD-10-CM

## 2013-06-09 DIAGNOSIS — R7302 Impaired glucose tolerance (oral): Secondary | ICD-10-CM

## 2013-06-09 LAB — CBC WITH DIFFERENTIAL/PLATELET
BASOS PCT: 0 % (ref 0–1)
Basophils Absolute: 0 10*3/uL (ref 0.0–0.1)
EOS ABS: 0.3 10*3/uL (ref 0.0–0.7)
EOS PCT: 3 % (ref 0–5)
HEMATOCRIT: 40.6 % (ref 36.0–46.0)
HEMOGLOBIN: 14.9 g/dL (ref 12.0–15.0)
Lymphocytes Relative: 19 % (ref 12–46)
Lymphs Abs: 2.2 10*3/uL (ref 0.7–4.0)
MCH: 32 pg (ref 26.0–34.0)
MCHC: 36.7 g/dL — AB (ref 30.0–36.0)
MCV: 87.3 fL (ref 78.0–100.0)
MONO ABS: 1.6 10*3/uL — AB (ref 0.1–1.0)
MONOS PCT: 14 % — AB (ref 3–12)
NEUTROS ABS: 7.1 10*3/uL (ref 1.7–7.7)
Neutrophils Relative %: 64 % (ref 43–77)
Platelets: 172 10*3/uL (ref 150–400)
RBC: 4.65 MIL/uL (ref 3.87–5.11)
RDW: 12.3 % (ref 11.5–15.5)
WBC: 11.1 10*3/uL — ABNORMAL HIGH (ref 4.0–10.5)

## 2013-06-09 LAB — COMPREHENSIVE METABOLIC PANEL
ALBUMIN: 3.8 g/dL (ref 3.5–5.2)
ALT: 36 U/L — ABNORMAL HIGH (ref 0–35)
AST: 31 U/L (ref 0–37)
Alkaline Phosphatase: 56 U/L (ref 39–117)
BILIRUBIN TOTAL: 0.5 mg/dL (ref 0.3–1.2)
BUN: 29 mg/dL — AB (ref 6–23)
CALCIUM: 9.4 mg/dL (ref 8.4–10.5)
CHLORIDE: 95 meq/L — AB (ref 96–112)
CO2: 24 mEq/L (ref 19–32)
CREATININE: 1.29 mg/dL — AB (ref 0.50–1.10)
GFR calc Af Amer: 48 mL/min — ABNORMAL LOW (ref >90)
GFR calc non Af Amer: 42 mL/min — ABNORMAL LOW (ref >90)
Glucose, Bld: 130 mg/dL — ABNORMAL HIGH (ref 70–99)
Potassium: 3.9 mEq/L (ref 3.7–5.3)
Sodium: 135 mEq/L — ABNORMAL LOW (ref 137–147)
Total Protein: 8.2 g/dL (ref 6.0–8.3)

## 2013-06-09 MED ORDER — PIPERACILLIN-TAZOBACTAM 3.375 G IVPB: 3.3750 g | Freq: Three times a day (TID) | INTRAVENOUS | Status: DC

## 2013-06-09 MED ORDER — SODIUM CHLORIDE 0.9 % IV SOLN
1000.0000 mL | Freq: Once | INTRAVENOUS | Status: AC
  Administered 2013-06-09: 1000 mL via INTRAVENOUS

## 2013-06-09 MED ORDER — SODIUM CHLORIDE 0.9 % IV SOLN
1000.0000 mL | INTRAVENOUS | Status: DC
Start: 2013-06-09 — End: 2013-06-10

## 2013-06-09 MED ORDER — VANCOMYCIN HCL 10 G IV SOLR
1500.0000 mg | INTRAVENOUS | Status: DC
  Administered 2013-06-10 – 2013-06-11 (×2): 1500 mg via INTRAVENOUS
  Filled 2013-06-09 (×4): qty 1500

## 2013-06-09 MED ORDER — PIPERACILLIN-TAZOBACTAM 3.375 G IVPB 30 MIN: 3.3750 g | INTRAVENOUS | Status: DC

## 2013-06-09 NOTE — ED Notes (Signed)
Pt. reports worsening infection at left great toe with drainage / swelling extending to left foot and left calf. Denies fever or chills.

## 2013-06-09 NOTE — ED Notes (Signed)
Phlebotomy still at bedside attempting blood draw for blood cultures.

## 2013-06-09 NOTE — ED Notes (Signed)
Phlebotomy at bedside.

## 2013-06-09 NOTE — H&P (Signed)
PCP:   Oliver Barre, MD   Chief Complaint:  Red to left foot and leg  HPI: 68 yo female h/o peripheral neuropathy, chronic pain, hld this past Thursday (5 days ago) had high fever of 104, general malaise, chills, n/v went to urgent care.  At that time she had no rash.  Her left great toe had developed a little ulcer but was not red or draining or swollen at that time.  The urgent care doctor dx her with some viral illness, and put her on levaquin and tamiflu.  She went home.  The next day her left toe was swollen, draining and she had huge red rash that covered her left great toe up to the forefoot all the way up to the knee.  This was on Friday, by Friday night she said her fever broke she started to fill better over the weekend and the rash got much better.  She was taking the levaquin and the tamiflu.  Today she came to the ED because there was still some rash there and the left great toe is a little more swollen than it was before, but the rash is better.  The toe is not draining anything anymore.  No fevers since Friday (approx 4 days ago).  No chills, and overall she is feeling better since Friday.    Review of Systems:  Positive and negative as per HPI otherwise all other systems are negative  Past Medical History: Past Medical History  Diagnosis Date  . Acute meniscal tear of knee sept 2012    right knee  . Arthritis of foot, right     first mtp  . Blood in stool   . Migraine   . Cervical disc disease     s/p cervical spine surgury 1990  . Lumbar disc disease     s/p lumbar fusion 1999  . Allergy   . Asthma   . Hypertension   . Hypothyroidism   . IBS (irritable bowel syndrome) 07/03/2011  . Peripheral neuropathy 07/03/2011  . Impaired glucose tolerance 07/03/2011  . 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 Insomnia 07/15/2011  . Chronic low back pain 07/15/2011  . Hyperlipidemia 07/15/2011  . Restrictive lung disease 07/15/2011   Past Surgical History  Procedure Laterality  Date  . Lumbar fusion    . Cholecystectomy  1967  . Appendectomy  1962  . Tonsillectomy  1967  . Abdominal hysterectomy  1974  . Carpal tunnel release      x 2  . Cervical laminectomy    . Hemorrhoid surgery      Medications: Prior to Admission medications   Medication Sig Start Date End Date Taking? Authorizing Provider  atenolol (TENORMIN) 50 MG tablet Take 1 tablet (50 mg total) by mouth daily. 08/05/12  Yes Corwin Levins, MD  BIOTIN PO Take 2 tablets by mouth daily.    Yes Historical Provider, MD  Cholecalciferol (VITAMIN D3) 1000 UNITS CAPS Take 2,000 Units by mouth daily.    Yes Historical Provider, MD  HYDROcodone-acetaminophen (NORCO/VICODIN) 5-325 MG per tablet Take 1 tablet by mouth every 6 (six) hours as needed for pain.   Yes Historical Provider, MD  hyoscyamine (LEVBID) 0.375 MG 12 hr tablet Take 0.375 mg by mouth every 12 (twelve) hours as needed for cramping.   Yes Historical Provider, MD  indapamide (LOZOL) 2.5 MG tablet Take 1 tablet (2.5 mg total) by mouth daily. 08/05/12  Yes Corwin Levins, MD  levofloxacin [facility]) 500  MG tablet Take 500 mg by mouth daily.   Yes Historical Provider, MD  nabumetone (RELAFEN) 500 MG tablet Take 500 mg by mouth daily.   Yes Historical Provider, MD  potassium chloride (MICRO-K) 10 MEQ CR capsule Take 1 capsule (10 mEq total) by mouth daily. 08/05/12  Yes Corwin Levins, MD  quinapril (ACCUPRIL) 40 MG tablet Take 1 tablet (40 mg total) by mouth daily. 08/05/12  Yes Corwin Levins, MD  thyroid (ARMOUR) 120 MG tablet Take 120 mg by mouth daily before breakfast.   Yes Historical Provider, MD  oseltamivir (TAMIFLU) 75 MG capsule Take 75 mg by mouth 2 (two) times daily.    Historical Provider, MD    Allergies:   Allergies  Allergen Reactions  . Aspirin Swelling    Throat closes  . Celebrex [Celecoxib] Hives  . Dipyridamole     Per pt: "opposite effect"  . Eggs Or Egg-Derived Products     diarrhea  . Erythromycin Hives  . Gabapentin     Per pt:  uknown  . Iodine Swelling    Throat closes  . Penicillins Diarrhea    Social History:  reports that she has never smoked. She has never used smokeless tobacco. She reports that she does not drink alcohol or use illicit drugs.  Family History: Family History  Problem Relation Age of Onset  . Alcohol abuse Other   . Cancer Other     breast cancer  . Stroke Other   . Hypertension Other   . Mental illness Other   . Arthritis Other   . Alcohol abuse Other   . Arthritis Other   . Cancer Other     prostate cancer and breast cancer  . Hyperlipidemia Other   . Heart disease Other   . Hypertension Other   . Diabetes Other   . Colon cancer Neg Hx     Physical Exam: Filed Vitals:   06/09/13 1925 06/09/13 2045 06/09/13 2100 06/09/13 2115  BP: 129/75 114/67  119/67  Pulse: 75 68  68  Temp: 98.1 F (36.7 C)     TempSrc: Oral     Resp: 18     Height:   5\' 3"  (1.6 m)   Weight: 97.297 kg (214 lb 8 oz)     SpO2: 94% 97%  100%   General appearance: alert, cooperative and no distress Head: Normocephalic, without obvious abnormality, atraumatic Eyes: negative Nose: Nares normal. Septum midline. Mucosa normal. No drainage or sinus tenderness. Neck: no JVD and supple, symmetrical, trachea midline Lungs: clear to auscultation bilaterally Heart: regular rate and rhythm, S1, S2 normal, no murmur, click, rub or gallop Abdomen: soft, non-tender; bowel sounds normal; no masses,  no organomegaly Extremities: extremities normal, atraumatic, no cyanosis or edema except some swelling to left great toe with ulceration and erythema around toe and forefoot with some up to mid calf area c/w infected wound of left great toe with surrounding cellulitis no flunctuance or induration appreciated around toe, toe also has some bluish discolartion Pulses: 2+ and symmetric Skin: Skin color, texture, turgor normal. No rashes or lesions except for above Neurologic: Grossly normal    Labs on Admission:    Recent Labs  06/09/13 1930  NA 135*  K 3.9  CL 95*  CO2 24  GLUCOSE 130*  BUN 29*  CREATININE 1.29*  CALCIUM 9.4    Recent Labs  06/09/13 1930  AST 31  ALT 36*  ALKPHOS 56  BILITOT 0.5  PROT 8.2  ALBUMIN 3.8    Recent Labs  06/09/13 1930  WBC 11.1*  NEUTROABS 7.1  HGB 14.9  HCT 40.6  MCV 87.3  PLT 172   Radiological Exams on Admission: Dg Toe Great Left  06/09/2013   CLINICAL DATA:  Skin ulceration great toe.  EXAM: LEFT GREAT TOE  COMPARISON:  None.  FINDINGS: There appears to be a skin ulceration on the plantar surface of the great toe without underlying radiopaque foreign body or soft tissue gas collection. No bony destructive change is seen. There is no fracture dislocation.  IMPRESSION: Skin ulceration great toe without plain film evidence of osteomyelitis. Negative for foreign body.   Electronically Signed   By: Drusilla Kannerhomas  Dalessio M.D.   On: 06/09/2013 20:28    Assessment/Plan  68 yo female with lle cellulitis +/- absess of left great toe  Principal Problem:   Cellulitis and abscess of toe of left foot-  Cellulitis has been responding to levaquin she has been on, will cont this and add vancomycin to her regimen.  Will likely need to call surgery in am to evaluate if this needs any debridement.  Xrays do not show any signs of osteomyelitis.  Area of erythema has been marked out with pen on admit for more adequate monitoring.  Active Problems:   Hypertension   Peripheral neuropathy   Wound, open, toe   Chronic low back pain    Derricka Mertz A 06/09/2013, 10:39 PM

## 2013-06-09 NOTE — ED Provider Notes (Signed)
CSN: [id number]     Arrival date & time 06/09/13  1912 History   First MD Initiated Contact with Patient 06/09/13 2038     Chief Complaint  Patient presents with  . Wound Infection   (Consider location/radiation/quality/duration/timing/severity/associated sxs/prior Treatment) HPI  This is a 68 year old female who presents emergency department chief complaint of left foot infection.  The patient has had dark discoloration on the base of her big toe for the past 2 weeks.  Patient states that she's had multiple social issues preventing her from seeking care.  She has a history of peripheral neuropathy but denies a history of diabetes.  She does have a history of previous ulceration of the foot that she states took approximately 2 years to heal.  The patient states that she was seen on Thursday of last week 06/04/2013 for a different complaint at an urgent care in [facility] where she was diagnosed with Norovirus given Tamiflu, Levaquin, and "a real strong antibiotic shot."  The patient states that she's noticed pus from the bottom of the foot.  The last 2 days she has noticed heat, redness, swelling, streaking up of her left calf, and severe pain.  She states that she did have chills and fever however she feels it was related to her norovirus and not her current infection . She denies pain with movement of the joint.  She does have some pain with ambulation, patient does have a history of decreased sensation secondary to her neuropathy.      Past Medical History  Diagnosis Date  . Acute meniscal tear of knee sept 2012    right knee  . Arthritis of foot, right     first mtp  . Blood in stool   . Migraine   . Cervical disc disease     s/p cervical spine surgury 1990  . Lumbar disc disease     s/p lumbar fusion 1999  . Allergy   . Asthma   . Hypertension   . Hypothyroidism   . IBS (irritable bowel syndrome) 07/03/2011  . Peripheral neuropathy 07/03/2011  . Impaired glucose  tolerance 07/03/2011  . 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 Insomnia 07/15/2011  . Chronic low back pain 07/15/2011  . Hyperlipidemia 07/15/2011  . Restrictive lung disease 07/15/2011   Past Surgical History  Procedure Laterality Date  . Lumbar fusion    . Cholecystectomy  1967  . Appendectomy  1962  . Tonsillectomy  1967  . Abdominal hysterectomy  1974  . Carpal tunnel release      x 2  . Cervical laminectomy    . Hemorrhoid surgery     Family History  Problem Relation Age of Onset  . Alcohol abuse Other   . Cancer Other     breast cancer  . Stroke Other   . Hypertension Other   . Mental illness Other   . Arthritis Other   . Alcohol abuse Other   . Arthritis Other   . Cancer Other     prostate cancer and breast cancer  . Hyperlipidemia Other   . Heart disease Other   . Hypertension Other   . Diabetes Other   . Colon cancer Neg Hx    History  Substance Use Topics  . Smoking status: Never Smoker   . Smokeless tobacco: Never Used  . Alcohol Use: No   OB History   Grav Para Term Preterm Abortions TAB SAB Ect Mult Living  Review of Systems Ten systems reviewed and are negative for acute change, except as noted in the HPI.   Allergies  Aspirin; Celebrex; Dipyridamole; Eggs or egg-derived products; Erythromycin; Gabapentin; Iodine; and Penicillins  Home Medications   Current Outpatient Rx  Name  Route  Sig  Dispense  Refill  . atenolol (TENORMIN) 50 MG tablet   Oral   Take 1 tablet (50 mg total) by mouth daily.   90 tablet   3   . BIOTIN PO   Oral   Take 2 tablets by mouth daily.          . Cholecalciferol (VITAMIN D3) 1000 UNITS CAPS   Oral   Take 2,000 Units by mouth daily.          Marland Kitchen HYDROcodone-acetaminophen (NORCO/VICODIN) 5-325 MG per tablet   Oral   Take 1 tablet by mouth every 6 (six) hours as needed for pain.         . hyoscyamine (LEVBID) 0.375 MG 12 hr tablet   Oral   Take 0.375 mg by mouth every 12 (twelve)  hours as needed for cramping.         . indapamide (LOZOL) 2.5 MG tablet   Oral   Take 1 tablet (2.5 mg total) by mouth daily.   90 tablet   3   . levofloxacin (LEVAQUIN) 500 MG tablet   Oral   Take 500 mg by mouth daily.         . nabumetone (RELAFEN) 500 MG tablet   Oral   Take 500 mg by mouth daily.         . potassium chloride (MICRO-K) 10 MEQ CR capsule   Oral   Take 1 capsule (10 mEq total) by mouth daily.   90 capsule   3   . quinapril (ACCUPRIL) 40 MG tablet   Oral   Take 1 tablet (40 mg total) by mouth daily.   90 tablet   3   . thyroid (ARMOUR) 120 MG tablet   Oral   Take 120 mg by mouth daily before breakfast.         . oseltamivir (TAMIFLU) 75 MG capsule   Oral   Take 75 mg by mouth 2 (two) times daily.          BP 129/75  Pulse 75  Temp(Src) 98.1 F (36.7 C) (Oral)  Resp 18  Wt 214 lb 8 oz (97.297 kg)  SpO2 94% Physical Exam  Constitutional: She is oriented to person, place, and time. She appears well-developed and well-nourished. No distress.  HENT:  Head: Normocephalic and atraumatic.  Eyes: Conjunctivae are normal. No scleral icterus.  Neck: Normal range of motion.  Cardiovascular: Normal rate, regular rhythm and normal heart sounds.  Exam reveals no gallop and no friction rub.   No murmur heard. Pulmonary/Chest: Effort normal and breath sounds normal. No respiratory distress.  Abdominal: Soft. Bowel sounds are normal. She exhibits no distension and no mass. There is no tenderness. There is no guarding.  Neurological: She is alert and oriented to person, place, and time.  Skin: Skin is warm and dry. She is not diaphoretic.  Base of the left toe with black eschar under a large callus.  There is purulent drainage.  There is heat, redness, swelling of the great toe and metatarsals.  There perhaps around the ankle.  She has history of the medial thigh.  Multiple petechiae noted on the dorsal surface of the left foot.    ED  Course   Procedures (including critical care time) Labs Review Labs Reviewed  CBC WITH DIFFERENTIAL - Abnormal; Notable for the following:    WBC 11.1 (*)    MCHC 36.7 (*)    Monocytes Relative 14 (*)    Monocytes Absolute 1.6 (*)    All other components within normal limits  COMPREHENSIVE METABOLIC PANEL - Abnormal; Notable for the following:    Sodium 135 (*)    Chloride 95 (*)    Glucose, Bld 130 (*)    BUN 29 (*)    Creatinine, Ser 1.29 (*)    ALT 36 (*)    GFR calc non Af Amer 42 (*)    GFR calc Af Amer 48 (*)    All other components within normal limits   Imaging Review Dg Toe Great Left  06/09/2013   CLINICAL DATA:  Skin ulceration great toe.  EXAM: LEFT GREAT TOE  COMPARISON:  None.  FINDINGS: There appears to be a skin ulceration on the plantar surface of the great toe without underlying radiopaque foreign body or soft tissue gas collection. No bony destructive change is seen. There is no fracture dislocation.  IMPRESSION: Skin ulceration great toe without plain film evidence of osteomyelitis. Negative for foreign body.   Electronically Signed   By: Drusilla Kannerhomas  Dalessio M.D.   On: 06/09/2013 20:28    EKG Interpretation   None       MDM  No diagnosis found. Filed Vitals:   06/09/13 1925 06/09/13 2100  BP: 129/75   Pulse: 75   Temp: 98.1 F (36.7 C)   TempSrc: Oral   Resp: 18   Height:  5\' 3"  (1.6 m)  Weight: 214 lb 8 oz (97.297 kg)   SpO2: 94%     patient here with wound infection, cellulitis.  She did not appear to need surgery sepsis criteria.  Afebrile.  Due to the nature of the patient's infection as well as her age to do feel she will need admission for IV antibiotics.  Her x-rays negative for osteomyelitis however patient may need MRI for further evaluation of the joint space.  patient admitted by Dr. Onalee Huaavid. The patient appears reasonably stabilized for admission considering the current resources, flow, and capabilities available in the ED at this time, and I doubt  any other [facility] screening and/or treatment in the ED prior to admission.   Arthor CaptainAbigail Emberlynn Riggan, PA-C 06/10/13 0025

## 2013-06-09 NOTE — ED Notes (Signed)
Pt stating she has pain. Offered her a Vicodin per admitting orders but pt refused. Pt given a [place] sandwich and agrees to take the Vicodin when she gets to the unit after she eats.

## 2013-06-09 NOTE — ED Notes (Signed)
Pt reports she developed an "ulcer" on her left big toe about 2 months ago and went to urgent care this past Thursday and received a shot "for a virus and ever since then I noticed my left big toe began to turn black."

## 2013-06-09 NOTE — ED Notes (Signed)
Dr. David at bedside. 

## 2013-06-09 NOTE — Progress Notes (Signed)
ANTIBIOTIC CONSULT NOTE - INITIAL  Pharmacy Consult for Vancomycin and Zosyn Indication: cellulitis  Allergies  Allergen Reactions  . Aspirin Swelling    Throat closes  . Celebrex [Celecoxib] Hives  . Dipyridamole     Per pt: "opposite effect"  . Eggs Or Egg-Derived Products     diarrhea  . Erythromycin Hives  . Gabapentin     Per pt: uknown  . Iodine Swelling    Throat closes  . Penicillins Diarrhea    Patient Measurements: Height: 5\' 3"  (160 cm) Weight: 214 lb 8 oz (97.297 kg) IBW/kg (Calculated) : 52.4  Vital Signs: Temp: 98.1 F (36.7 C) (02/03 1925) Temp src: Oral (02/03 1925) BP: 129/75 mmHg (02/03 1925) Pulse Rate: 75 (02/03 1925) Intake/Output from previous day:   Intake/Output from this shift:    Labs:  Recent Labs  06/09/13 1930  WBC 11.1*  HGB 14.9  PLT 172  CREATININE 1.29*   Estimated Creatinine Clearance: 46.4 ml/min (by C-G formula based on Cr of 1.29). No results found for this basename: VANCOTROUGH, VANCOPEAK, VANCORANDOM, GENTTROUGH, GENTPEAK, GENTRANDOM, TOBRATROUGH, TOBRAPEAK, TOBRARND, AMIKACINPEAK, AMIKACINTROU, AMIKACIN,  in the last 72 hours   Microbiology: No results found for this or any previous visit (from the past 720 hour(s)).  Medical History: Past Medical History  Diagnosis Date  . Acute meniscal tear of knee sept 2012    right knee  . Arthritis of foot, right     first mtp  . Blood in stool   . Migraine   . Cervical disc disease     s/p cervical spine surgury 1990  . Lumbar disc disease     s/p lumbar fusion 1999  . Allergy   . Asthma   . Hypertension   . Hypothyroidism   . IBS (irritable bowel syndrome) 07/03/2011  . Peripheral neuropathy 07/03/2011  . Impaired glucose tolerance 07/03/2011  . 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 Insomnia 07/15/2011  . Chronic low back pain 07/15/2011  . Hyperlipidemia 07/15/2011  . Restrictive lung disease 07/15/2011    Medications:  See electronic med  rec  Assessment: 68 y.o. female presents with L great toe drainage and swelling. Pt was on levaquin and oseltamavir as o/p (started 1/29). To begin broad spectum antibiotics for cellulitis. Noted allergy to PCN is diarrhea.  Goal of Therapy:  Vancomycin trough level 10-15 mcg/ml  Plan:  1. Zosyn 3.375gm IV now over 30 minutes then 3.375gm IV q8h - subsequent doses over 4 hours 2. Vancomycin 1500 mg IV q24h. 3. Will f/u micro data, renal function, pt's clinical condition, trough prn  Christoper Fabianaron Aislinn Feliz, PharmD, BCPS Clinical pharmacist, pager [phone] 06/09/2013,9:26 PM

## 2013-06-10 ENCOUNTER — Encounter (HOSPITAL_COMMUNITY): Payer: Self-pay

## 2013-06-10 LAB — BASIC METABOLIC PANEL
BUN: 25 mg/dL — AB (ref 6–23)
CALCIUM: 8.6 mg/dL (ref 8.4–10.5)
CO2: 24 meq/L (ref 19–32)
Chloride: 103 mEq/L (ref 96–112)
Creatinine, Ser: 1.16 mg/dL — ABNORMAL HIGH (ref 0.50–1.10)
GFR calc Af Amer: 55 mL/min — ABNORMAL LOW (ref >90)
GFR calc non Af Amer: 47 mL/min — ABNORMAL LOW (ref >90)
GLUCOSE: 119 mg/dL — AB (ref 70–99)
Potassium: 3.6 mEq/L — ABNORMAL LOW (ref 3.7–5.3)
SODIUM: 140 meq/L (ref 137–147)

## 2013-06-10 LAB — CBC
HCT: 33.9 % — ABNORMAL LOW (ref 36.0–46.0)
Hemoglobin: 11.9 g/dL — ABNORMAL LOW (ref 12.0–15.0)
MCH: 30.7 pg (ref 26.0–34.0)
MCHC: 35.1 g/dL (ref 30.0–36.0)
MCV: 87.6 fL (ref 78.0–100.0)
PLATELETS: 152 10*3/uL (ref 150–400)
RBC: 3.87 MIL/uL (ref 3.87–5.11)
RDW: 12.3 % (ref 11.5–15.5)
WBC: 10.1 10*3/uL (ref 4.0–10.5)

## 2013-06-10 MED ORDER — NABUMETONE 500 MG PO TABS
500.0000 mg | ORAL_TABLET | Freq: Every day | ORAL | Status: DC
  Administered 2013-06-10 – 2013-06-14 (×5): 500 mg via ORAL
  Filled 2013-06-10 (×5): qty 1

## 2013-06-10 MED ORDER — ENOXAPARIN SODIUM 40 MG/0.4ML ~~LOC~~ SOLN
40.0000 mg | SUBCUTANEOUS | Status: DC
  Administered 2013-06-10 – 2013-06-14 (×5): 40 mg via SUBCUTANEOUS
  Filled 2013-06-10 (×5): qty 0.4

## 2013-06-10 MED ORDER — SODIUM CHLORIDE 0.9 % IJ SOLN
3.0000 mL | Freq: Two times a day (BID) | INTRAMUSCULAR | Status: DC
  Administered 2013-06-10 – 2013-06-14 (×9): 3 mL via INTRAVENOUS

## 2013-06-10 MED ORDER — HYDROCODONE-ACETAMINOPHEN 5-325 MG PO TABS
2.0000 | ORAL_TABLET | Freq: Four times a day (QID) | ORAL | Status: DC | PRN
  Administered 2013-06-10: 1 via ORAL
  Administered 2013-06-11 – 2013-06-14 (×4): 2 via ORAL
  Filled 2013-06-10 (×3): qty 2
  Filled 2013-06-10: qty 1
  Filled 2013-06-10: qty 2

## 2013-06-10 MED ORDER — HYDROCODONE-ACETAMINOPHEN 5-325 MG PO TABS
1.0000 | ORAL_TABLET | Freq: Four times a day (QID) | ORAL | Status: DC | PRN
  Administered 2013-06-10 (×2): 1 via ORAL
  Filled 2013-06-10 (×2): qty 1

## 2013-06-10 MED ORDER — SILVER SULFADIAZINE 1 % EX CREA
TOPICAL_CREAM | Freq: Every day | CUTANEOUS | Status: DC
  Administered 2013-06-10 – 2013-06-14 (×5): via TOPICAL
  Filled 2013-06-10: qty 85

## 2013-06-10 MED ORDER — THYROID 120 MG PO TABS
120.0000 mg | ORAL_TABLET | Freq: Every day | ORAL | Status: DC
  Administered 2013-06-10 – 2013-06-14 (×5): 120 mg via ORAL
  Filled 2013-06-10 (×6): qty 1

## 2013-06-10 MED ORDER — SODIUM CHLORIDE 0.9 % IJ SOLN: 3.0000 mL | INTRAMUSCULAR | Status: DC | PRN

## 2013-06-10 MED ORDER — SODIUM CHLORIDE 0.9 % IV SOLN
250.0000 mL | INTRAVENOUS | Status: DC | PRN
Start: 2013-06-10 — End: 2013-06-14

## 2013-06-10 MED ORDER — ATENOLOL 50 MG PO TABS
50.0000 mg | ORAL_TABLET | Freq: Every day | ORAL | Status: DC
  Administered 2013-06-10 – 2013-06-14 (×5): 50 mg via ORAL
  Filled 2013-06-10 (×5): qty 1

## 2013-06-10 MED ORDER — LEVOFLOXACIN 500 MG PO TABS
500.0000 mg | ORAL_TABLET | Freq: Every day | ORAL | Status: DC
  Administered 2013-06-10 – 2013-06-12 (×3): 500 mg via ORAL
  Filled 2013-06-10 (×3): qty 1

## 2013-06-10 MED ORDER — WHITE PETROLATUM GEL
Status: AC
  Administered 2013-06-10: 0.2
  Filled 2013-06-10: qty 5

## 2013-06-10 MED ORDER — INDAPAMIDE 2.5 MG PO TABS
2.5000 mg | ORAL_TABLET | Freq: Every day | ORAL | Status: DC
  Administered 2013-06-10 – 2013-06-14 (×5): 2.5 mg via ORAL
  Filled 2013-06-10 (×6): qty 1

## 2013-06-10 MED ORDER — HYOSCYAMINE SULFATE ER 0.375 MG PO TB12
0.3750 mg | ORAL_TABLET | Freq: Two times a day (BID) | ORAL | Status: DC | PRN
  Filled 2013-06-10: qty 1

## 2013-06-10 NOTE — Progress Notes (Signed)
[facility] PROGRESS NOTE  Sheryl Copeland MWU:[id number] DOB: 1946/04/10 DOA: 06/09/2013 PCP: Oliver Barre, MD Brief HPI: 68 yo female h/o peripheral neuropathy, chronic pain, hld this past Thursday (5 days ago) had high fever of 104, general malaise, chills, n/v went to urgent care. The urgent care doctor dx her with some viral illness, and put her on levaquin and tamiflu. She went home. The next day her left toe was swollen, draining and she had huge red rash that covered her left great toe up to the forefoot all the way up to the knee.Today she came to the ED because there was still some rash there and the left great toe is a little more swollen than it was before, but the rash is better. The toe is not draining anything anymore. No fevers since Friday (approx 4 days ago). No chills, and overall she is feeling better since Friday    Assessment/Plan: 1. Cellulitis and abscess of the left toe: - she was started on IV vancomycin and levaquin.  - elevate the leg.  - orthopedics consulted for possible I&d - X RAY S do no show OM.  - pain control.   2. Hypertension - controlled.   3/ peripheral neuropathy:  - resume home medications.  - controlled.   DVT prophylaxis.  Code Status: full code Family Communication: family at bedside, discussed the plan of care with them too Disposition Plan: pending PT eval.    Consultants:  Orthopedics  Wound care.   Procedures:  none  Antibiotics:  Vancomycin 2/3  levaquin 2/3  HPI/Subjective: Pain controlled.  Left toe swelling going on for 2 months.   Objective: Filed Vitals:   06/10/13 1032  BP: 124/46  Pulse: 72  Temp: 98.2 F (36.8 C)  Resp: 18    Intake/Output Summary (Last 24 hours) at 06/10/13 1839 Last data filed at 06/10/13 0900  Gross per 24 hour  Intake    360 ml  Output      0 ml  Net    360 ml   Filed Weights   06/09/13 1925 06/10/13 0029  Weight: 97.297 kg (214 lb 8 oz) 95 kg (209 lb 7 oz)     Exam:   General:  Alert afebrile comfortable  Cardiovascular: s1s2  Respiratory: ctab  Abdomen: soft NT ND BS+  Musculoskeletal: left leg cellulitis with great toe swelling and tenderness. No draining.   Data Reviewed: Basic Metabolic Panel:  Recent Labs Lab 06/09/13 1930 06/10/13 0545  NA 135* 140  K 3.9 3.6*  CL 95* 103  CO2 24 24  GLUCOSE 130* 119*  BUN 29* 25*  CREATININE 1.29* 1.16*  CALCIUM 9.4 8.6   Liver Function Tests:  Recent Labs Lab 06/09/13 1930  AST 31  ALT 36*  ALKPHOS 56  BILITOT 0.5  PROT 8.2  ALBUMIN 3.8   No results found for this basename: LIPASE, AMYLASE,  in the last 168 hours No results found for this basename: AMMONIA,  in the last 168 hours CBC:  Recent Labs Lab 06/09/13 1930 06/10/13 0545  WBC 11.1* 10.1  NEUTROABS 7.1  --   HGB 14.9 11.9*  HCT 40.6 33.9*  MCV 87.3 87.6  PLT 172 152   Cardiac Enzymes: No results found for this basename: CKTOTAL, CKMB, CKMBINDEX, TROPONINI,  in the last 168 hours BNP (last 3 results) No results found for this basename: PROBNP,  in the last 8760 hours CBG: No results found for this basename: GLUCAP,  in the last  168 hours  No results found for this or any previous visit (from the past 240 hour(s)).   Studies: Dg Toe Great Left  06/09/2013   CLINICAL DATA:  Skin ulceration great toe.  EXAM: LEFT GREAT TOE  COMPARISON:  None.  FINDINGS: There appears to be a skin ulceration on the plantar surface of the great toe without underlying radiopaque foreign body or soft tissue gas collection. No bony destructive change is seen. There is no fracture dislocation.  IMPRESSION: Skin ulceration great toe without plain film evidence of osteomyelitis. Negative for foreign body.   Electronically Signed   By: Drusilla Kannerhomas  Dalessio M.D.   On: 06/09/2013 20:28    Scheduled Meds: . atenolol  50 mg Oral Daily  . enoxaparin (LOVENOX) injection  40 mg Subcutaneous Q24H  . indapamide  2.5 mg Oral Daily  .  levofloxacin  500 mg Oral Daily  . nabumetone  500 mg Oral Daily  . silver sulfADIAZINE   Topical Daily  . sodium chloride  3 mL Intravenous Q12H  . thyroid  120 mg Oral QAC breakfast  . vancomycin  1,500 mg Intravenous Q24H   Continuous Infusions:   Principal Problem:   Cellulitis and abscess of toe of left foot Active Problems:   Hypertension   Peripheral neuropathy   Wound, open, toe   Chronic low back pain   Cellulitis    Time spent: 35 min    Jersey Espinoza  [facility] Pager [phone] If 7PM-7AM, please contact night-coverage at [email or web address], password [email or web address] 06/10/2013, 6:39 PM  LOS: 1 day

## 2013-06-10 NOTE — Progress Notes (Signed)
Chaplain received a consult request to speak with patient regarding AD. Patient said she discussed getting an AD but has not made a decision to have it done. Chaplain asked patient to inform nurse if she would like to get an AD done at a later date.   06/10/13 1100  Clinical Encounter Type  Visited With Patient  Visit Type Initial

## 2013-06-10 NOTE — Consult Note (Signed)
WOC wound consult note Reason for Consult: Left great toe and left dorsal  And medial foot cellulitis, present on admission.   Wound type: Infectious/Inflammatory Measurement: 3.6 cm x 4.2 cm  Wound bed: 100% dark and calloused tissue.  Drainage (amount, consistency, odor) None at this time.  Patient reports a large burst of pus from wound center on toe prior to hospital admission.  Periwound: Edematous and erythematous.  Line drawn around erythema circumference on admission and this is improved today.  Dressing procedure/placement/frequency: Patient is currently on Vancomycin.  Begin Silvadene cream to toe.  Cleanse with NS and pat gently dry.  Apply Silvadene cream and secure with dry dressing and tape. Change daily.  Will not follow at this time.  Please re-consult if needed.  Maple HudsonKaren Shardae Kleinman RN BSN CWON Pager [phone]

## 2013-06-11 DIAGNOSIS — L0291 Cutaneous abscess, unspecified: Secondary | ICD-10-CM

## 2013-06-11 DIAGNOSIS — L0390 Cellulitis, unspecified: Secondary | ICD-10-CM

## 2013-06-11 LAB — VITAMIN B12: VITAMIN B 12: 528 pg/mL (ref 211–911)

## 2013-06-11 LAB — URIC ACID: URIC ACID, SERUM: 9.9 mg/dL — AB (ref 2.4–7.0)

## 2013-06-11 LAB — HEMOGLOBIN A1C
HEMOGLOBIN A1C: 5.9 % — AB (ref <5.7)
MEAN PLASMA GLUCOSE: 123 mg/dL — AB (ref <117)

## 2013-06-11 LAB — IRON AND TIBC
Iron: 27 ug/dL — ABNORMAL LOW (ref 42–135)
Saturation Ratios: 12 % — ABNORMAL LOW (ref 20–55)
TIBC: 232 ug/dL — ABNORMAL LOW (ref 250–470)
UIBC: 205 ug/dL (ref 125–400)

## 2013-06-11 LAB — RETICULOCYTES
RBC.: 4.32 MIL/uL (ref 3.87–5.11)
RETIC CT PCT: 1.4 % (ref 0.4–3.1)
Retic Count, Absolute: 60.5 10*3/uL (ref 19.0–186.0)

## 2013-06-11 LAB — GLUCOSE, CAPILLARY
GLUCOSE-CAPILLARY: 123 mg/dL — AB (ref 70–99)
Glucose-Capillary: 114 mg/dL — ABNORMAL HIGH (ref 70–99)

## 2013-06-11 LAB — FOLATE: Folate: >20 ng/mL

## 2013-06-11 LAB — FERRITIN: FERRITIN: 478 ng/mL — AB (ref 10–291)

## 2013-06-11 MED ORDER — COLCHICINE 0.6 MG PO TABS
0.6000 mg | ORAL_TABLET | Freq: Two times a day (BID) | ORAL | Status: DC
  Administered 2013-06-11 – 2013-06-14 (×7): 0.6 mg via ORAL
  Filled 2013-06-11 (×8): qty 1

## 2013-06-11 MED ORDER — TRAMADOL-ACETAMINOPHEN 37.5-325 MG PO TABS
1.0000 | ORAL_TABLET | ORAL | Status: DC | PRN
Start: 2013-06-11 — End: 2013-06-14
  Administered 2013-06-11: 2 via ORAL
  Filled 2013-06-11 (×2): qty 2

## 2013-06-11 NOTE — Progress Notes (Signed)
[facility] PROGRESS NOTE  Sheryl EthSheryl D Copeland ONG:[id number] DOB: 20-Sep-1945 DOA: 06/09/2013 PCP: Oliver BarreJames John, MD Brief HPI: 68 yo female h/o peripheral neuropathy, chronic pain, hld this past Thursday (5 days ago) had high fever of 104, general malaise, chills, n/v went to urgent care. The urgent care doctor dx her with some viral illness, and put her on levaquin and tamiflu. She went home. The next day her left toe was swollen, draining and she had huge red rash that covered her left great toe up to the forefoot all the way up to the knee.Today she came to the ED because there was still some rash there and the left great toe is a little more swollen than it was before, but the rash is better. The toe is not draining anything anymore. No fevers since Friday (approx 4 days ago). No chills, and overall she is feeling better since Friday    Assessment/Plan: 1. Cellulitis and abscess of the  Great left toe/ ? Possible gout:  - she was started on IV vancomycin and levaquin.  - elevate the leg.and start her on cochicine.  - venous dopplers of the left LE.  - orthopedics consulted for possible I&d, recommended darco shoe for ambulatory use.  - X RAY S do not show OM.  - pain control as needed.  -PT ordered.    2. Hypertension - controlled.   3/ peripheral neuropathy:  - resume home medications.  - controlled.   DVT prophylaxis.  Code Status: full code Family Communication: family at bedside, discussed the plan of care with them too Disposition Plan: pending PT eval.    Consultants:  Orthopedics  Wound care.   Procedures:  none  Antibiotics:  Vancomycin 2/3  levaquin 2/3  HPI/Subjective: Pain controlled.  Left toe swelling going on for 2 months.   Objective: Filed Vitals:   06/11/13 1202  BP: 125/76  Pulse: 63  Temp: 98.4 F (36.9 C)  Resp: 18    Intake/Output Summary (Last 24 hours) at 06/11/13 1555 Last data filed at 06/11/13 0900  Gross per 24  hour  Intake    240 ml  Output      0 ml  Net    240 ml   Filed Weights   06/09/13 1925 06/10/13 0029 06/10/13 2054  Weight: 97.297 kg (214 lb 8 oz) 95 kg (209 lb 7 oz) 94.5 kg (208 lb 5.4 oz)    Exam:   General:  Alert afebrile comfortable  Cardiovascular: s1s2  Respiratory: ctab  Abdomen: soft NT ND BS+  Musculoskeletal: left leg cellulitis with great toe swelling and tenderness. No draining.   Data Reviewed: Basic Metabolic Panel:  Recent Labs Lab 06/09/13 1930 06/10/13 0545  NA 135* 140  K 3.9 3.6*  CL 95* 103  CO2 24 24  GLUCOSE 130* 119*  BUN 29* 25*  CREATININE 1.29* 1.16*  CALCIUM 9.4 8.6   Liver Function Tests:  Recent Labs Lab 06/09/13 1930  AST 31  ALT 36*  ALKPHOS 56  BILITOT 0.5  PROT 8.2  ALBUMIN 3.8   No results found for this basename: LIPASE, AMYLASE,  in the last 168 hours No results found for this basename: AMMONIA,  in the last 168 hours CBC:  Recent Labs Lab 06/09/13 1930 06/10/13 0545  WBC 11.1* 10.1  NEUTROABS 7.1  --   HGB 14.9 11.9*  HCT 40.6 33.9*  MCV 87.3 87.6  PLT 172 152   Cardiac Enzymes: No results found for this basename:  CKTOTAL, CKMB, CKMBINDEX, TROPONINI,  in the last 168 hours BNP (last 3 results) No results found for this basename: PROBNP,  in the last 8760 hours CBG: No results found for this basename: GLUCAP,  in the last 168 hours  Recent Results (from the past 240 hour(s))  CULTURE, BLOOD (ROUTINE X 2)     Status: None   Collection Time    06/09/13 11:05 PM      Result Value Range Status   Specimen Description BLOOD LEFT HAND   Final   Special Requests BOTTLES DRAWN AEROBIC ONLY 5CC   Final   Culture  Setup Time     Final   Value: 06/10/2013 03:31     Performed at [facility]   Culture     Final   Value:        BLOOD CULTURE RECEIVED NO GROWTH TO DATE CULTURE WILL BE HELD FOR 5 DAYS BEFORE ISSUING A FINAL NEGATIVE REPORT     Performed at [facility]   Report Status PENDING    Incomplete  CULTURE, BLOOD (ROUTINE X 2)     Status: None   Collection Time    06/09/13 11:35 PM      Result Value Range Status   Specimen Description BLOOD LEFT FOREARM   Final   Special Requests BOTTLES DRAWN AEROBIC AND ANAEROBIC 5CC   Final   Culture  Setup Time     Final   Value: 06/10/2013 03:31     Performed at [facility]   Culture     Final   Value:        BLOOD CULTURE RECEIVED NO GROWTH TO DATE CULTURE WILL BE HELD FOR 5 DAYS BEFORE ISSUING A FINAL NEGATIVE REPORT     Performed at [facility]   Report Status PENDING   Incomplete     Studies: Dg Toe Great Left  06/09/2013   CLINICAL DATA:  Skin ulceration great toe.  EXAM: LEFT GREAT TOE  COMPARISON:  None.  FINDINGS: There appears to be a skin ulceration on the plantar surface of the great toe without underlying radiopaque foreign body or soft tissue gas collection. No bony destructive change is seen. There is no fracture dislocation.  IMPRESSION: Skin ulceration great toe without plain film evidence of osteomyelitis. Negative for foreign body.   Electronically Signed   By: Drusilla Kanner M.D.   On: 06/09/2013 20:28    Scheduled Meds: . atenolol  50 mg Oral Daily  . colchicine  0.6 mg Oral BID  . enoxaparin (LOVENOX) injection  40 mg Subcutaneous Q24H  . indapamide  2.5 mg Oral Daily  . levofloxacin  500 mg Oral Daily  . nabumetone  500 mg Oral Daily  . silver sulfADIAZINE   Topical Daily  . sodium chloride  3 mL Intravenous Q12H  . thyroid  120 mg Oral QAC breakfast  . vancomycin  1,500 mg Intravenous Q24H   Continuous Infusions:   Principal Problem:   Cellulitis and abscess of toe of left foot Active Problems:   Hypertension   Peripheral neuropathy   Wound, open, toe   Chronic low back pain   Cellulitis    Time spent: 35 min    Sheryl Copeland  [facility] Pager [phone] If 7PM-7AM, please contact night-coverage at [email or web address], password [facility] 06/11/2013, 3:55 PM  LOS: 2 days

## 2013-06-11 NOTE — Consult Note (Signed)
Reason for Consult: Cellulitis ulceration left great toe Referring Physician: Dr Akula  Sheryl Copeland is an 68 y.o. female.  HPI: Patient is a 68-year-old woman with peripheral neuropathy elevated blood glucose who presents with cellulitis ulceration left great toe. Patient states that she was treated for similar episode several years ago to the same toe. Patient also reports a history of gout.  Past Medical History  Diagnosis Date  . Acute meniscal tear of knee sept 2012    right knee  . Arthritis of foot, right     first mtp  . Blood in stool   . Migraine   . Cervical disc disease     s/p cervical spine surgury 1990  . Lumbar disc disease     s/p lumbar fusion 1999  . Allergy   . Asthma   . Hypertension   . Hypothyroidism   . IBS (irritable bowel syndrome) 07/03/2011  . Peripheral neuropathy 07/03/2011  . Impaired glucose tolerance 07/03/2011  . 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 Insomnia 07/15/2011  . Chronic low back pain 07/15/2011  . Hyperlipidemia 07/15/2011  . Restrictive lung disease 07/15/2011    Past Surgical History  Procedure Laterality Date  . Lumbar fusion    . Cholecystectomy  1967  . Appendectomy  1962  . Tonsillectomy  1967  . Abdominal hysterectomy  1974  . Carpal tunnel release      x 2  . Cervical laminectomy    . Hemorrhoid surgery      Family History  Problem Relation Age of Onset  . Alcohol abuse Other   . Cancer Other     breast cancer  . Stroke Other   . Hypertension Other   . Mental illness Other   . Arthritis Other   . Alcohol abuse Other   . Arthritis Other   . Cancer Other     prostate cancer and breast cancer  . Hyperlipidemia Other   . Heart disease Other   . Hypertension Other   . Diabetes Other   . Colon cancer Neg Hx     Social History:  reports that she has never smoked. She has never used smokeless tobacco. She reports that she does not drink alcohol or use illicit drugs.  Allergies:  Allergies   Allergen Reactions  . Aspirin Swelling    Throat closes  . Celebrex [Celecoxib] Hives  . Dipyridamole     Per pt: "opposite effect"  . Eggs Or Egg-Derived Products     diarrhea  . Erythromycin Hives  . Gabapentin     Per pt: uknown  . Iodine Swelling    Throat closes  . Penicillins Diarrhea    Medications: I have reviewed the patient's current medications.  Results for orders placed during the hospital encounter of 06/09/13 (from the past 48 hour(s))  CBC WITH DIFFERENTIAL     Status: Abnormal   Collection Time    06/09/13  7:30 PM      Result Value Range   WBC 11.1 (*) 4.0 - 10.5 K/uL   RBC 4.65  3.87 - 5.11 MIL/uL   Hemoglobin 14.9  12.0 - 15.0 g/dL   HCT 40.6  36.0 - 46.0 %   MCV 87.3  78.0 - 100.0 fL   MCH 32.0  26.0 - 34.0 pg   MCHC 36.7 (*) 30.0 - 36.0 g/dL   RDW 12.3  11.5 - 15.5 %   Platelets 172  150 - 400 K/uL   Neutrophils   Relative % 64  43 - 77 %   Neutro Abs 7.1  1.7 - 7.7 K/uL   Lymphocytes Relative 19  12 - 46 %   Lymphs Abs 2.2  0.7 - 4.0 K/uL   Monocytes Relative 14 (*) 3 - 12 %   Monocytes Absolute 1.6 (*) 0.1 - 1.0 K/uL   Eosinophils Relative 3  0 - 5 %   Eosinophils Absolute 0.3  0.0 - 0.7 K/uL   Basophils Relative 0  0 - 1 %   Basophils Absolute 0.0  0.0 - 0.1 K/uL  COMPREHENSIVE METABOLIC PANEL     Status: Abnormal   Collection Time    06/09/13  7:30 PM      Result Value Range   Sodium 135 (*) 137 - 147 mEq/L   Potassium 3.9  3.7 - 5.3 mEq/L   Chloride 95 (*) 96 - 112 mEq/L   CO2 24  19 - 32 mEq/L   Glucose, Bld 130 (*) 70 - 99 mg/dL   BUN 29 (*) 6 - 23 mg/dL   Creatinine, Ser 1.29 (*) 0.50 - 1.10 mg/dL   Calcium 9.4  8.4 - 10.5 mg/dL   Total Protein 8.2  6.0 - 8.3 g/dL   Albumin 3.8  3.5 - 5.2 g/dL   AST 31  0 - 37 U/L   ALT 36 (*) 0 - 35 U/L   Alkaline Phosphatase 56  39 - 117 U/L   Total Bilirubin 0.5  0.3 - 1.2 mg/dL   GFR calc non Af Amer 42 (*) >90 mL/min   GFR calc Af Amer 48 (*) >90 mL/min   Comment: (NOTE)     The eGFR  has been calculated using the CKD EPI equation.     This calculation has not been validated in all clinical situations.     eGFR's persistently <90 mL/min signify possible Chronic Kidney     Disease.  BASIC METABOLIC PANEL     Status: Abnormal   Collection Time    06/10/13  5:45 AM      Result Value Range   Sodium 140  137 - 147 mEq/L   Potassium 3.6 (*) 3.7 - 5.3 mEq/L   Chloride 103  96 - 112 mEq/L   CO2 24  19 - 32 mEq/L   Glucose, Bld 119 (*) 70 - 99 mg/dL   BUN 25 (*) 6 - 23 mg/dL   Creatinine, Ser 1.16 (*) 0.50 - 1.10 mg/dL   Calcium 8.6  8.4 - 10.5 mg/dL   GFR calc non Af Amer 47 (*) >90 mL/min   GFR calc Af Amer 55 (*) >90 mL/min   Comment: (NOTE)     The eGFR has been calculated using the CKD EPI equation.     This calculation has not been validated in all clinical situations.     eGFR's persistently <90 mL/min signify possible Chronic Kidney     Disease.  CBC     Status: Abnormal   Collection Time    06/10/13  5:45 AM      Result Value Range   WBC 10.1  4.0 - 10.5 K/uL   RBC 3.87  3.87 - 5.11 MIL/uL   Hemoglobin 11.9 (*) 12.0 - 15.0 g/dL   Comment: DELTA CHECK NOTED     REPEATED TO VERIFY   HCT 33.9 (*) 36.0 - 46.0 %   MCV 87.6  78.0 - 100.0 fL   MCH 30.7  26.0 - 34.0 pg   MCHC 35.1  30.0 - 36.0 g/dL   RDW 12.3  11.5 - 15.5 %   Platelets 152  150 - 400 K/uL    Dg Toe Great Left  06/09/2013   CLINICAL DATA:  Skin ulceration great toe.  EXAM: LEFT GREAT TOE  COMPARISON:  None.  FINDINGS: There appears to be a skin ulceration on the plantar surface of the great toe without underlying radiopaque foreign body or soft tissue gas collection. No bony destructive change is seen. There is no fracture dislocation.  IMPRESSION: Skin ulceration great toe without plain film evidence of osteomyelitis. Negative for foreign body.   Electronically Signed   By: Inge Rise M.D.   On: 06/09/2013 20:28    Review of Systems  All other systems reviewed and are negative.   Blood  pressure 128/68, pulse 70, temperature 98.8 F (37.1 C), temperature source Oral, resp. rate 18, height 5' 3" (1.6 m), weight 94.5 kg (208 lb 5.4 oz), SpO2 97.00%. Physical Exam On examination patient has a palpable dorsalis pedis pulse. The areas outlined and cellulitis have resolved. She has ecchymosis on the plantar aspect of the left great toe but no open ulcer. There is no sausage digit swelling no cellulitis of the toe. Radiograph shows no bony abnormalities. 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 Assessment/Plan: Assessment: Hallux rigidus heel cord contracture left, with resolving cellulitis and ulceration to the left great toe. Rule out gout  Plan: Will get the patient a Darco shoe to unload the great toe. Recommend discharged with one-month doxycycline. I will followup in the office in 2 weeks. Order a uric acid level treat accordingly.  Cobain Morici V 06/11/2013, 6:33 AM

## 2013-06-11 NOTE — Progress Notes (Signed)
Orthopedic Tech Progress Note Patient Details:  Sheryl Copeland [birth year] [id number] Darco shoe fit for patient and left at bedside for ambulatory use. Ortho Devices Type of Ortho Device: Darco shoe Ortho Device/Splint Location: Left LE Ortho Device/Splint Interventions: Ordered;Application   Asia R Thompson 06/11/2013, 7:52 AM

## 2013-06-11 NOTE — Progress Notes (Signed)
Utilization review completed. Edith Groleau, RN, BSN. 

## 2013-06-11 NOTE — Discharge Instructions (Signed)
Wear her Darco shoe on the left for ambulation.

## 2013-06-11 NOTE — ED Provider Notes (Signed)
Medical screening examination/treatment/procedure(s) were conducted as a shared visit with non-physician practitioner(s) and myself.  I personally evaluated the patient during the encounter.   Patient will need to be admitted to the hospital for cellulitis and possible deep space infection.  IV antibiotics.  No obvious drainable abscess on physical exam.  Patient will likely benefit from MRI scan in the morning to further evaluate.  Good pulses in foot     Lyanne CoKevin M Venancio Chenier, MD 06/11/13 [phone]

## 2013-06-12 ENCOUNTER — Inpatient Hospital Stay (HOSPITAL_COMMUNITY): Payer: Medicare HMO

## 2013-06-12 DIAGNOSIS — M7989 Other specified soft tissue disorders: Secondary | ICD-10-CM

## 2013-06-12 DIAGNOSIS — M1A09X0 Idiopathic chronic gout, multiple sites, without tophus (tophi): Secondary | ICD-10-CM | POA: Insufficient documentation

## 2013-06-12 DIAGNOSIS — M109 Gout, unspecified: Secondary | ICD-10-CM | POA: Diagnosis present

## 2013-06-12 DIAGNOSIS — M25561 Pain in right knee: Secondary | ICD-10-CM | POA: Diagnosis present

## 2013-06-12 LAB — BASIC METABOLIC PANEL
BUN: 20 mg/dL (ref 6–23)
CALCIUM: 8.9 mg/dL (ref 8.4–10.5)
CO2: 25 mEq/L (ref 19–32)
Chloride: 100 mEq/L (ref 96–112)
Creatinine, Ser: 1.04 mg/dL (ref 0.50–1.10)
GFR calc non Af Amer: 54 mL/min — ABNORMAL LOW (ref >90)
GFR, EST AFRICAN AMERICAN: 63 mL/min — AB (ref >90)
Glucose, Bld: 109 mg/dL — ABNORMAL HIGH (ref 70–99)
Potassium: 4 mEq/L (ref 3.7–5.3)
Sodium: 138 mEq/L (ref 137–147)

## 2013-06-12 MED ORDER — ONDANSETRON HCL 4 MG/2ML IJ SOLN
4.0000 mg | Freq: Four times a day (QID) | INTRAMUSCULAR | Status: DC | PRN
  Administered 2013-06-12: 4 mg via INTRAVENOUS
  Filled 2013-06-12: qty 2

## 2013-06-12 MED ORDER — PROMETHAZINE HCL 25 MG/ML IJ SOLN: 12.5000 mg | INTRAMUSCULAR | Status: DC | PRN

## 2013-06-12 MED ORDER — DOXYCYCLINE HYCLATE 100 MG PO TABS
100.0000 mg | ORAL_TABLET | Freq: Two times a day (BID) | ORAL | Status: DC
  Administered 2013-06-12 – 2013-06-14 (×5): 100 mg via ORAL
  Filled 2013-06-12 (×6): qty 1

## 2013-06-12 MED ORDER — SIMETHICONE 80 MG PO CHEW
80.0000 mg | CHEWABLE_TABLET | Freq: Four times a day (QID) | ORAL | Status: DC | PRN
  Administered 2013-06-12 – 2013-06-14 (×4): 80 mg via ORAL
  Filled 2013-06-12 (×6): qty 1

## 2013-06-12 NOTE — Progress Notes (Signed)
[facility] PROGRESS NOTE  Sheryl Copeland WUJ:[id number] DOB: Nov 19, 1945 DOA: 06/09/2013 PCP: Oliver Barre, MD Brief HPI: 68 yo female h/o peripheral neuropathy, chronic pain, hld this past Thursday (5 days ago) had high fever of 104, general malaise, chills, n/v went to urgent care. The urgent care doctor dx her with some viral illness, and put her on levaquin and tamiflu. She went home. The next day her left toe was swollen, draining and she had huge red rash that covered her left great toe up to the forefoot all the way up to the knee.Today she came to the ED because there was still some rash there and the left great toe is a little more swollen than it was before, but the rash is better. The toe is not draining anything anymore. No fevers since Friday (approx 4 days ago). No chills, and overall she is feeling better since Friday    Assessment/Plan: 1. Cellulitis and abscess of the  Great left toe/ ? Possible gout:  - she was started on IV vancomycin and levaquin completed 4 days of IV antibiotics, changed to po doxycycline today.  - elevate the leg.and start her on cochicine.  - venous dopplers of the left LE negative for DVT.  - orthopedics consulted for possible I&d, recommended darco shoe for ambulatory use.  - X RAY S do not show OM.  - pain control as needed.  -PT ordered.  -   2. Hypertension - controlled.   3/ peripheral neuropathy:  - resume home medications.  - controlled.   4. Right knee pain: no effusion evident on exam. 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 X rays ordered and requested ortho to see if she needs intra articular steroid injection for pain  DVT prophylaxis.  Code Status: full code Family Communication: family at bedside, discussed the plan of care with them too Disposition Plan: home with home PT    Consultants:  Orthopedics  Wound care.   Procedures:  none  Antibiotics:  Vancomycin 2/3  levaquin  2/3  HPI/Subjective: Pain controlled.  WORKING WITH physical therapy, reports worsening right knee pain on ambulation.  Left toe swelling going on for 2 months.   Objective: Filed Vitals:   06/12/13 1421  BP: 126/63  Pulse: 60  Temp: 97.7 F (36.5 C)  Resp: 18    Intake/Output Summary (Last 24 hours) at 06/12/13 1431 Last data filed at 06/12/13 1422  Gross per 24 hour  Intake    960 ml  Output      0 ml  Net    960 ml   Filed Weights   06/10/13 0029 06/10/13 2054 06/11/13 2125  Weight: 95 kg (209 lb 7 oz) 94.5 kg (208 lb 5.4 oz) 94.394 kg (208 lb 1.6 oz)    Exam:   General:  Alert afebrile comfortable  Cardiovascular: s1s2  Respiratory: ctab  Abdomen: soft NT ND BS+  Musculoskeletal: left leg cellulitis with great toe swelling and tenderness. No draining.   Data Reviewed: Basic Metabolic Panel:  Recent Labs Lab 06/09/13 1930 06/10/13 0545 06/12/13 0348  NA 135* 140 138  K 3.9 3.6* 4.0  CL 95* 103 100  CO2 24 24 25   GLUCOSE 130* 119* 109*  BUN 29* 25* 20  CREATININE 1.29* 1.16* 1.04  CALCIUM 9.4 8.6 8.9   Liver Function Tests:  Recent Labs Lab 06/09/13 1930  AST 31  ALT 36*  ALKPHOS 56  BILITOT 0.5  PROT 8.2  ALBUMIN 3.8  No results found for this basename: LIPASE, AMYLASE,  in the last 168 hours No results found for this basename: AMMONIA,  in the last 168 hours CBC:  Recent Labs Lab 06/09/13 1930 06/10/13 0545  WBC 11.1* 10.1  NEUTROABS 7.1  --   HGB 14.9 11.9*  HCT 40.6 33.9*  MCV 87.3 87.6  PLT 172 152   Cardiac Enzymes: No results found for this basename: CKTOTAL, CKMB, CKMBINDEX, TROPONINI,  in the last 168 hours BNP (last 3 results) No results found for this basename: PROBNP,  in the last 8760 hours CBG:  Recent Labs Lab 06/11/13 1202 06/11/13 1620  GLUCAP 123* 114*    Recent Results (from the past 240 hour(s))  CULTURE, BLOOD (ROUTINE X 2)     Status: None   Collection Time    06/09/13 11:05 PM      Result  Value Range Status   Specimen Description BLOOD LEFT HAND   Final   Special Requests BOTTLES DRAWN AEROBIC ONLY 5CC   Final   Culture  Setup Time     Final   Value: 06/10/2013 03:31     [facility]   Culture     Final   Value:        BLOOD CULTURE RECEIVED NO GROWTH TO DATE CULTURE WILL BE HELD FOR 5 DAYS BEFORE ISSUING A FINAL NEGATIVE REPORT     [facility]formed at [facility]   Report Status PENDING   Incomplete  CULTURE, BLOOD (ROUTINE X 2)     Status: None   Collection Time    06/09/13 11:35 PM      Result Value Range Status   Specimen Description BLOOD LEFT FOREARM   Final   Special Requests BOTTLES DRAWN AEROBIC AND ANAEROBIC 5CC   Final   Culture  Setup Time     Final   Value: 06/10/2013 03:31     [facility]   Culture     Final   Value:        BLOOD CULTURE RECEIVED NO GROWTH TO DATE CULTURE WILL BE HELD FOR 5 DAYS BEFORE ISSUING A FINAL NEGATIVE REPORT     [facility]   Report Status PENDING   Incomplete     Studies: No results found.  Scheduled Meds: . atenolol  50 mg Oral Daily  . colchicine  0.6 mg Oral BID  . doxycycline  100 mg Oral Q12H  . enoxaparin (LOVENOX) injection  40 mg Subcutaneous Q24H  . indapamide  2.5 mg Oral Daily  . nabumetone  500 mg Oral Daily  . silver sulfADIAZINE   Topical Daily  . sodium chloride  3 mL Intravenous Q12H  . thyroid  120 mg Oral QAC breakfast   Continuous Infusions:   Principal Problem:   Cellulitis and abscess of toe of left foot Active Problems:   Hypertension   Peripheral neuropathy   Wound, open, toe   Chronic low back pain   Cellulitis    Time spent: 35 min    Carrington Mullenax  [facility] Pager [phone] If 7PM-7AM, please contact night-coverage at [email or web address], password [email or web address] 06/12/2013, 2:31 PM  LOS: 3 days

## 2013-06-12 NOTE — Progress Notes (Signed)
*  PRELIMINARY RESULTS* Vascular Ultrasound Left lower extremity venous duplex has been completed.  Preliminary findings: no evidence of DVT   Farrel DemarkJill Eunice, RDMS, RVT  06/12/2013, 9:06 AM

## 2013-06-12 NOTE — Evaluation (Signed)
Physical Therapy Evaluation Patient Details Name: Sheryl PollenSheryl D Sowers-Hoak MRN: [id number] DOB: Feb 02, 1946 Today's Date: 06/12/2013 Time: [id number] PT Time Calculation (min): 19 min  PT Assessment / Plan / Recommendation History of Present Illness  Patient is a 68 y/o female admitted due to left toe redness and swelling up the leg.  PMH positive for right knee meniscal tear (she reports is due to use of Darco shoe on the left in the past,) impaired glucose tolerance and peripheral neuropathy.  Clinical Impression  Patient presents with decreased independence with mobility due to deficits listed below.  She will benefit from skilled PT in the acute setting to allow return home with family assist and HHPT.  May benefit from right knee immobilizer for comfort.  PT Assessment  Patient needs continued PT services    Follow Up Recommendations  Home health PT;Supervision - Intermittent    Does the patient have the potential to tolerate intense rehabilitation    N/A  Barriers to Discharge  None      Equipment Recommendations  None recommended by PT    Recommendations for Other Services OT consult (may need tub/shower equipment)   Frequency Min 3X/week    Precautions / Restrictions Precautions Precautions: Fall Required Braces or Orthoses: Other Brace/Splint Other Brace/Splint: darco shoe left foot (not used due to pt report increses pain right knee and was cause of twisting injury right knee in the past.   Pertinent Vitals/Pain 6-7/10 left foot with ambulation (worse right knee with scooting out and trying to stand)      Mobility  Bed Mobility Overal bed mobility: Modified Independent General bed mobility comments: with effort scooting edge of bed due to hump in lower portion of the bed Transfers Overall transfer level: Needs assistance Equipment used: Rolling walker (2 wheeled) Transfers: Sit to/from Stand Sit to Stand: Supervision General transfer comment: for safety, pops up  quickly due to pain in knee with flexion and she holds it straight; stand to sit with supervision and cues for technique to sit with right knee extended safely Ambulation/Gait Ambulation/Gait assistance: Supervision Ambulation Distance (Feet): 20 Feet Assistive device: Rolling walker (2 wheeled) Gait Pattern/deviations: Step-through pattern;Decreased stride length;Antalgic General Gait Details: reports in standing more pain in left foot up to 7/10; relieved when weight taken off foot    Exercises     PT Diagnosis: Difficulty walking;Acute pain  PT Problem List: Decreased strength;Decreased mobility;Decreased range of motion;Decreased activity tolerance;Decreased balance;Decreased knowledge of use of DME;Decreased safety awareness;Pain PT Treatment Interventions: DME instruction;Therapeutic exercise;Gait training;Balance training;Neuromuscular re-education;Stair training;Functional mobility training;Therapeutic activities;Patient/family education     PT Goals(Current goals can be found in the care plan section) Acute Rehab PT Goals Patient Stated Goal: To return to independent PT Goal Formulation: With patient Time For Goal Achievement: 06/26/13 Potential to Achieve Goals: Good  Visit Information  Last PT Received On: 06/12/13 Assistance Needed: +1 History of Present Illness: Patient is a 68 y/o female admitted due to left toe redness and swelling up the leg.  PMH positive for right knee meniscal tear (she reports is due to use of Darco shoe on the left in the past,) impaired glucose tolerance and peripheral neuropathy.       Prior Functioning  Home Living Family/patient expects to be discharged to:: Private residence Living Arrangements: Spouse/significant other Available Help at Discharge: Family Home Access: Stairs to enter Entergy CorporationEntrance Stairs-Number of Steps: 2 on one side, 1 on the other Entrance Stairs-Rails: None Home Layout: One level Home Equipment: Environmental consultantWalker - 4  wheels Prior  Function Level of Independence: Independent Comments: normally cooks and cleans, but has been unable for about a week Communication Communication: No difficulties Dominant Hand: Right    Cognition  Cognition Arousal/Alertness: Awake/alert Behavior During Therapy: WFL for tasks assessed/performed Overall Cognitive Status: Within Functional Limits for tasks assessed    Extremity/Trunk Assessment Lower Extremity Assessment Lower Extremity Assessment: RLE deficits/detail;LLE deficits/detail RLE Deficits / Details: hip flexion and ankle DF WFL, knee flexion AAROM to approx 20-25 degrees with prepatellar pain, strength knee extension 3-/5 with pain RLE Sensation: history of peripheral neuropathy LLE Deficits / Details: AROM WFL with pain mid foot with ankle DF, strength grossly WFL LLE Sensation: history of peripheral neuropathy   Balance Balance Overall balance assessment: Needs assistance Sitting balance-Leahy Scale: Good Standing balance support: Bilateral upper extremity supported Standing balance-Leahy Scale: Poor Standing balance comment: due to decreased tolerance of weight in left foot needs bilateral UE assist for balance  End of Session PT - End of Session Equipment Utilized During Treatment: Gait belt Activity Tolerance: Patient limited by pain Patient left: in chair;with call bell/phone within reach Nurse Communication: Patient requests pain meds  GP     [facility] 06/12/2013, 11:12 AM Sheran Lawless, PT [phone] 06/12/2013

## 2013-06-13 DIAGNOSIS — M23302 Other meniscus derangements, unspecified lateral meniscus, unspecified knee: Secondary | ICD-10-CM

## 2013-06-13 DIAGNOSIS — M109 Gout, unspecified: Secondary | ICD-10-CM

## 2013-06-13 MED ORDER — PREDNISONE 20 MG PO TABS
40.0000 mg | ORAL_TABLET | Freq: Every day | ORAL | Status: DC
  Administered 2013-06-14: 40 mg via ORAL
  Filled 2013-06-13 (×2): qty 2

## 2013-06-13 MED ORDER — ALLOPURINOL 300 MG PO TABS
300.0000 mg | ORAL_TABLET | Freq: Every day | ORAL | Status: DC
  Administered 2013-06-13 – 2013-06-14 (×2): 300 mg via ORAL
  Filled 2013-06-13 (×2): qty 1

## 2013-06-13 MED ORDER — INDOMETHACIN 50 MG PO CAPS: 50.0000 mg | ORAL_CAPSULE | Freq: Two times a day (BID) | ORAL | Status: DC

## 2013-06-13 MED ORDER — PREDNISONE 50 MG PO TABS
60.0000 mg | ORAL_TABLET | Freq: Once | ORAL | Status: AC
  Administered 2013-06-13: 60 mg via ORAL
  Filled 2013-06-13: qty 1

## 2013-06-13 MED ORDER — PANTOPRAZOLE SODIUM 40 MG PO TBEC
40.0000 mg | DELAYED_RELEASE_TABLET | Freq: Every day | ORAL | Status: DC
  Administered 2013-06-13 – 2013-06-14 (×2): 40 mg via ORAL
  Filled 2013-06-13 (×2): qty 1

## 2013-06-13 NOTE — Progress Notes (Signed)
Orthopedic Tech Progress Note Patient Details:  Sheryl Copeland 31-Jan-1946 [id number]  Ortho Devices Type of Ortho Device: Knee Immobilizer Ortho Device/Splint Location: Left LE Ortho Device/Splint Interventions: Application   Cammer, Mickie BailJennifer Carol 06/13/2013, 4:12 PM

## 2013-06-13 NOTE — Progress Notes (Signed)
Patient ID: Sheryl Copeland  female  ZOX:[id number]    DOB: Dec 09, 1945    DOA: 06/09/2013  PCP: Oliver BarreJames John, MD  Assessment/Plan: Principal Problem:   Cellulitis and abscess of toe of left foot - Completed 4 days of IV vancomycin and levaquin, changed to po doxycycline - uric acid 9.9, was started on colchicine, placed on prednisone, patient is allergic to NSAIDs - venous dopplers of the left LE negative for DVT.  - orthopedics consulted for possible I&d, recommended darco shoe for ambulatory use.  - X-rays shows no osteomyelitis - pain control as needed.  - PT ordered   Right knee pain -  no effusion evident on exam. 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 Lajoyce Cornersuda, recommended also treating acute gout, prednisone, physical therapy. Recommended no cortisone shots.  Hypertension  - controlled.    peripheral neuropathy:  - cont home medications.   DVT Prophylaxis:  Code Status: Full code  Family Communication: Patient is alert and oriented x3, discussed with patient  Disposition:Likely DC home tomorrow    Subjective: Complaining of right knee pain, unable to bend data, unable to walk, cellulitis improving on the left leg  Objective: Weight change: -0.998 kg (-2 lb 3.2 oz)  Intake/Output Summary (Last 24 hours) at 06/13/13 1210 Last data filed at 06/13/13 0900  Gross per 24 hour  Intake    483 ml  Output      0 ml  Net    483 ml   Blood pressure 108/68, pulse 63, temperature 97.6 F (36.4 C), temperature source Oral, resp. rate 16, height 5\' 3"  (1.6 m), weight 93.396 kg (205 lb 14.4 oz), SpO2 97.00%.  Physical Exam: General: Alert and awake, oriented x3, not in any acute distress. CVS: S1-S2 clear, no murmur rubs or gallops Chest: clear to auscultation bilaterally, no wheezing, rales or rhonchi Abdomen: soft nontender,  nondistended, normal bowel sounds  Extremities: no cyanosis, clubbing or edema noted bilaterally, cellulitis improving the left leg, Dressing intact on the great toe left foot, range of motion decrease in the right leg due to right knee pain  Lab Results: Basic Metabolic Panel:  Recent Labs Lab 06/10/13 0545 06/12/13 0348  NA 140 138  K 3.6* 4.0  CL 103 100  CO2 24 25  GLUCOSE 119* 109*  BUN 25* 20  CREATININE 1.16* 1.04  CALCIUM 8.6 8.9   Liver Function Tests:  Recent Labs Lab 06/09/13 1930  AST 31  ALT 36*  ALKPHOS 56  BILITOT 0.5  PROT 8.2  ALBUMIN 3.8   No results found for this basename: LIPASE, AMYLASE,  in the last 168 hours No results found for this basename: AMMONIA,  in the last 168 hours CBC:  Recent Labs Lab 06/09/13 1930 06/10/13 0545  WBC 11.1* 10.1  NEUTROABS 7.1  --   HGB 14.9 11.9*  HCT 40.6 33.9*  MCV 87.3 87.6  PLT 172 152   Cardiac Enzymes: No results found for this basename: CKTOTAL, CKMB, CKMBINDEX, TROPONINI,  in the last 168 hours BNP: No components found with this basename: POCBNP,  CBG:  Recent Labs Lab 06/11/13 1202 06/11/13 1620  GLUCAP 123* 114*     Micro Results: Recent Results (from the past 240 hour(s))  CULTURE, BLOOD (ROUTINE X 2)     Status: None   Collection Time    06/09/13 11:05  PM      Result Value Range Status   Specimen Description BLOOD LEFT HAND   Final   Special Requests BOTTLES DRAWN AEROBIC ONLY 5CC   Final   Culture  Setup Time     Final   Value: 06/10/2013 03:31     Performed at [facility]   Culture     Final   Value:        BLOOD CULTURE RECEIVED NO GROWTH TO DATE CULTURE WILL BE HELD FOR 5 DAYS BEFORE ISSUING A FINAL NEGATIVE REPORT     Performed at [facility]   Report Status PENDING   Incomplete  CULTURE, BLOOD (ROUTINE X 2)     Status: None   Collection Time    06/09/13 11:35 PM      Result Value Range Status   Specimen Description BLOOD LEFT FOREARM   Final    Special Requests BOTTLES DRAWN AEROBIC AND ANAEROBIC 5CC   Final   Culture  Setup Time     Final   Value: 06/10/2013 03:31     Performed at [facility]   Culture     Final   Value:        BLOOD CULTURE RECEIVED NO GROWTH TO DATE CULTURE WILL BE HELD FOR 5 DAYS BEFORE ISSUING A FINAL NEGATIVE REPORT     Performed at [facility]   Report Status PENDING   Incomplete    Studies/Results: Dg Knee Complete 4 Views Right  06/12/2013   CLINICAL DATA:  Knee pain.  EXAM: RIGHT KNEE - COMPLETE 4+ VIEW  COMPARISON:  MR KNEE*R* W/O CM dated 01/13/2011; DG KNEE COMPLETE 4 VIEWS*R* dated 01/10/2011  FINDINGS: Early degenerative changes, best seen in the medial compartment with joint space narrowing and early spurring. No acute bony abnormality. Specifically, no fracture, subluxation, or dislocation. Soft tissues are intact.  IMPRESSION: No acute bony abnormality.   Electronically Signed   By: Charlett Nose M.D.   On: 06/12/2013 19:57   Dg Toe Great Left  06/09/2013   CLINICAL DATA:  Skin ulceration great toe.  EXAM: LEFT GREAT TOE  COMPARISON:  None.  FINDINGS: There appears to be a skin ulceration on the plantar surface of the great toe without underlying radiopaque foreign body or soft tissue gas collection. No bony destructive change is seen. There is no fracture dislocation.  IMPRESSION: Skin ulceration great toe without plain film evidence of osteomyelitis. Negative for foreign body.   Electronically Signed   By: Drusilla Kanner M.D.   On: 06/09/2013 20:28    Medications: Scheduled Meds: . allopurinol  300 mg Oral Daily  . atenolol  50 mg Oral Daily  . colchicine  0.6 mg Oral BID  . doxycycline  100 mg Oral Q12H  . enoxaparin (LOVENOX) injection  40 mg Subcutaneous Q24H  . indapamide  2.5 mg Oral Daily  . nabumetone  500 mg Oral Daily  . pantoprazole  40 mg Oral Q0600  . [START ON 06/14/2013] predniSONE  40 mg Oral Q breakfast  . predniSONE  60 mg Oral Once  . silver sulfADIAZINE    Topical Daily  . sodium chloride  3 mL Intravenous Q12H  . thyroid  120 mg Oral QAC breakfast      LOS: 4 days   Kaito Schulenburg M.D. [facility] 06/13/2013, 12:10 PM Pager: [phone]  If 7PM-7AM, please contact night-coverage [email or web address] Password [email or web address]

## 2013-06-14 DIAGNOSIS — T7840XA Allergy, unspecified, initial encounter: Secondary | ICD-10-CM

## 2013-06-14 DIAGNOSIS — J019 Acute sinusitis, unspecified: Secondary | ICD-10-CM

## 2013-06-14 LAB — CBC
HCT: 39.3 % (ref 36.0–46.0)
Hemoglobin: 14.3 g/dL (ref 12.0–15.0)
MCH: 31.6 pg (ref 26.0–34.0)
MCHC: 36.4 g/dL — AB (ref 30.0–36.0)
MCV: 86.9 fL (ref 78.0–100.0)
Platelets: 268 10*3/uL (ref 150–400)
RBC: 4.52 MIL/uL (ref 3.87–5.11)
RDW: 11.8 % (ref 11.5–15.5)
WBC: 12.1 10*3/uL — ABNORMAL HIGH (ref 4.0–10.5)

## 2013-06-14 LAB — BASIC METABOLIC PANEL
BUN: 30 mg/dL — ABNORMAL HIGH (ref 6–23)
CO2: 24 mEq/L (ref 19–32)
CREATININE: 1.14 mg/dL — AB (ref 0.50–1.10)
Calcium: 9.4 mg/dL (ref 8.4–10.5)
Chloride: 96 mEq/L (ref 96–112)
GFR calc non Af Amer: 48 mL/min — ABNORMAL LOW (ref >90)
GFR, EST AFRICAN AMERICAN: 56 mL/min — AB (ref >90)
GLUCOSE: 123 mg/dL — AB (ref 70–99)
Potassium: 4.6 mEq/L (ref 3.7–5.3)
Sodium: 137 mEq/L (ref 137–147)

## 2013-06-14 MED ORDER — PREDNISONE 10 MG PO TABS: ORAL_TABLET | ORAL | Status: DC

## 2013-06-14 MED ORDER — HYDROCODONE-ACETAMINOPHEN 5-325 MG PO TABS: 1.0000 | ORAL_TABLET | Freq: Four times a day (QID) | ORAL | Status: DC | PRN

## 2013-06-14 MED ORDER — COLCHICINE 0.6 MG PO TABS: 0.6000 mg | ORAL_TABLET | Freq: Every day | ORAL | Status: DC

## 2013-06-14 MED ORDER — PROMETHAZINE HCL 12.5 MG PO TABS: 12.5000 mg | ORAL_TABLET | Freq: Four times a day (QID) | ORAL | Status: DC | PRN

## 2013-06-14 MED ORDER — DOXYCYCLINE HYCLATE 100 MG PO TABS: 100.0000 mg | ORAL_TABLET | Freq: Two times a day (BID) | ORAL | Status: DC

## 2013-06-14 MED ORDER — SILVER SULFADIAZINE 1 % EX CREA: TOPICAL_CREAM | Freq: Every day | CUTANEOUS | Status: DC

## 2013-06-14 MED ORDER — ALLOPURINOL 300 MG PO TABS: 300.0000 mg | ORAL_TABLET | Freq: Every day | ORAL | Status: DC

## 2013-06-14 MED ORDER — SIMETHICONE 80 MG PO CHEW: 80.0000 mg | CHEWABLE_TABLET | Freq: Four times a day (QID) | ORAL | Status: DC | PRN

## 2013-06-14 MED ORDER — PANTOPRAZOLE SODIUM 40 MG PO TBEC: 40.0000 mg | DELAYED_RELEASE_TABLET | Freq: Every day | ORAL | Status: DC

## 2013-06-14 MED ORDER — TRAMADOL-ACETAMINOPHEN 37.5-325 MG PO TABS: 1.0000 | ORAL_TABLET | Freq: Four times a day (QID) | ORAL | Status: DC | PRN

## 2013-06-14 NOTE — Discharge Summary (Signed)
Physician Discharge Summary  Patient ID: Sheryl Copeland MRN: [id number] DOB/AGE: [birth year] 68 y.o.  Admit date: 06/09/2013 Discharge date: 06/14/2013  Primary Care Physician:  Oliver Barre, MD  Discharge Diagnoses:   . Cellulitis and abscess of toe of left foot . Hypertension . Chronic low back pain . Wound, open, toe . acute gout  . Peripheral neuropathy . Cellulitis . Right knee pain  Consults:  Orthopedics, Dr. Lajoyce Corners   Recommendations for Outpatient Follow-up:  Patient was recommended darco shoe for ambulatory use  Patient was also recommended to follow up with Dr. Lajoyce Corners in the office in 2 weeks  Allergies:   Allergies  Allergen Reactions  . Aspirin Swelling    Throat closes  . Celebrex [Celecoxib] Hives  . Dipyridamole     Per pt: "opposite effect"  . Eggs Or Egg-Derived Products     diarrhea  . Erythromycin Hives  . Gabapentin     Per pt: uknown  . Iodine Swelling    Throat closes  . Penicillins Diarrhea     Discharge Medications:   Medication List    STOP taking these medications       levofloxacin 500 MG tablet  Commonly known as:  LEVAQUIN     oseltamivir 75 MG capsule  Commonly known as:  TAMIFLU     potassium chloride 10 MEQ CR capsule  Commonly known as:  MICRO-K     quinapril 40 MG tablet  Commonly known as:  ACCUPRIL      TAKE these medications       allopurinol 300 MG tablet  Commonly known as:  ZYLOPRIM  Take 1 tablet (300 mg total) by mouth daily.     atenolol 50 MG tablet  Commonly known as:  TENORMIN  Take 1 tablet (50 mg total) by mouth daily.     BIOTIN PO  Take 2 tablets by mouth daily.     colchicine 0.6 MG tablet  Take 1 tablet (0.6 mg total) by mouth daily. Can take twice a day when having acute gout flare     doxycycline 100 MG tablet  Commonly known as:  VIBRA-TABS  Take 1 tablet (100 mg total) by mouth 2 (two) times daily. X 1 month     HYDROcodone-acetaminophen 5-325 MG per tablet  Commonly known  as:  NORCO/VICODIN  Take 1 tablet by mouth every 6 (six) hours as needed.     hyoscyamine 0.375 MG 12 hr tablet  Commonly known as:  LEVBID  Take 0.375 mg by mouth every 12 (twelve) hours as needed for cramping.     indapamide 2.5 MG tablet  Commonly known as:  LOZOL  Take 1 tablet (2.5 mg total) by mouth daily.     nabumetone 500 MG tablet  Commonly known as:  RELAFEN  Take 500 mg by mouth daily.     pantoprazole 40 MG tablet  Commonly known as:  PROTONIX  Take 1 tablet (40 mg total) by mouth daily.     predniSONE 10 MG tablet  Commonly known as:  DELTASONE  Prednisone dosing: Take  Prednisone 40mg  (4 tabs) x 2 days, then taper to 30mg  (3 tabs) x 3 days, then 20mg  (2 tabs) x 3days, then 10mg  (1 tab) x 3days, then OFF.     promethazine 12.5 MG tablet  Commonly known as:  PHENERGAN  Take 1 tablet (12.5 mg total) by mouth every 6 (six) hours as needed for nausea or vomiting.     silver sulfADIAZINE 1 %  cream  Commonly known as:  SILVADENE  Apply topically daily. Apply to left great toe daily.     simethicone 80 MG chewable tablet  Commonly known as:  MYLICON  Chew 1 tablet (80 mg total) by mouth 4 (four) times daily as needed for flatulence.     thyroid 120 MG tablet  Commonly known as:  ARMOUR  Take 120 mg by mouth daily before breakfast.     traMADol-acetaminophen 37.5-325 MG per tablet  Commonly known as:  ULTRACET  Take 1-2 tablets by mouth every 6 (six) hours as needed for severe pain.     Vitamin D3 1000 UNITS Caps  Take 2,000 Units by mouth daily.         Brief H and P: For complete details please refer to admission H and P, but in brief 68 yo female h/o peripheral neuropathy, chronic pain, hld was having fever, 104, malaise, chills and nausea and vomiting for last 5 days prior to admission. Patient went to the urgent care.  Her left great toe had developed a little ulcer but was not red or draining or swollen at that time. The urgent care doctor dx her with  some viral illness, and put her on levaquin and tamiflu. She went home. The next day her left toe was swollen, draining and she had rash with cellulitic changes that covered her left great toe up to the forefoot all the way up to the knee.  She was taking the levaquin and the tamiflu. Patient presented to the ER due to persistent cellulitic changes.  Hospital Course:  Cellulitis and abscess of toe of left foot. Patient was placed on IV vancomycin and Levaquin. Left lower extremity venous Dopplers did not show any DVT. Orthopedics was consulted, patient was evaluated by Dr. Lajoyce Cornersuda and recommended starting patient on doxycycline and ruling out acute gout attack. Uric acid was elevated at 9.9, and patient was started on colchicine. She is allergic to NSAIDs hence placed on prednisone and allopurinol.. Dr. Lajoyce Cornersuda recommended darco shoe for ambulatory use.  X-rays of the foot showed no osteomyelitis. Patient was recommended home health physical therapy with intermittent supervision.   Right knee pain - no effusion evident on exam. 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 I discussed with Dr. Lajoyce Cornersuda, recommended also treating acute gout, prednisone, physical therapy. Recommended no cortisone shots at this time.  Patient's symptoms have significantly improved after prednisone treatment and was also recommended knee immobilizer. Home health physical therapy was arranged.  Hypertension - controlled.   peripheral neuropathy: - cont home medications.     Day of Discharge BP 126/74  Pulse 56  Temp(Src) 95.6 F (35.3 C) (Oral)  Resp 17  Ht 5\' 3"  (1.6 m)  Wt 93.9 kg (207 lb 0.2 oz)  BMI 36.68 kg/m2  SpO2 95%  Physical Exam: General: Alert and awake oriented x3 not in any acute distress. CVS: S1-S2 clear no murmur rubs or gallops Chest: clear to auscultation bilaterally, no wheezing rales or  rhonchi Abdomen: soft nontender, nondistended, normal bowel sounds Extremities: no cyanosis, clubbing. Left lower extremity cellulitis improving, dressing on the great toe intact Neuro: Cranial nerves II-XII intact, no focal neurological deficits   The results of significant diagnostics from this hospitalization (including imaging, microbiology, ancillary and laboratory) are listed below for reference.    LAB RESULTS: Basic Metabolic Panel:  Recent Labs  Lab 06/12/13 0348 06/14/13 0540  NA 138 137  K 4.0 4.6  CL 100 96  CO2 25 24  GLUCOSE 109* 123*  BUN 20 30*  CREATININE 1.04 1.14*  CALCIUM 8.9 9.4   Liver Function Tests:  Recent Labs Lab 06/09/13 1930  AST 31  ALT 36*  ALKPHOS 56  BILITOT 0.5  PROT 8.2  ALBUMIN 3.8   No results found for this basename: LIPASE, AMYLASE,  in the last 168 hours No results found for this basename: AMMONIA,  in the last 168 hours CBC:  Recent Labs Lab 06/09/13 1930 06/10/13 0545 06/14/13 0540  WBC 11.1* 10.1 12.1*  NEUTROABS 7.1  --   --   HGB 14.9 11.9* 14.3  HCT 40.6 33.9* 39.3  MCV 87.3 87.6 86.9  PLT 172 152 268   Cardiac Enzymes: No results found for this basename: CKTOTAL, CKMB, CKMBINDEX, TROPONINI,  in the last 168 hours BNP: No components found with this basename: POCBNP,  CBG:  Recent Labs Lab 06/11/13 1202 06/11/13 1620  GLUCAP 123* 114*    Significant Diagnostic Studies:  Dg Toe Great Left  06/09/2013   CLINICAL DATA:  Skin ulceration great toe.  EXAM: LEFT GREAT TOE  COMPARISON:  None.  FINDINGS: There appears to be a skin ulceration on the plantar surface of the great toe without underlying radiopaque foreign body or soft tissue gas collection. No bony destructive change is seen. There is no fracture dislocation.  IMPRESSION: Skin ulceration great toe without plain film evidence of osteomyelitis. Negative for foreign body.   Electronically Signed   By: Drusilla Kanner M.D.   On: 06/09/2013 20:28        Disposition and Follow-up:     Discharge Orders   Future Orders Complete By Expires   Discharge instructions  As directed    Comments:     WOUND CARE: Left great toe. Cleanse with NS and pat gently dry.  Apply Silvadene cream and secure with dry dressing and tape. Change daily.   Increase activity slowly  As directed        DISPOSITION: Home   DIET: Heart healthy   DISCHARGE FOLLOW-UP Follow-up Information   Follow up with DUDA,MARCUS V, MD. Schedule an appointment as soon as possible for a visit in 2 weeks.   Specialty:  Orthopedic Surgery   Contact information:   [address] [place] [address] [phone]       Follow up with Oliver Barre, MD. Schedule an appointment as soon as possible for a visit in 2 weeks.   Specialties:  Internal Medicine, Radiology   Contact information:   [address] [place] [address] [phone]       Time spent on Discharge: 40 minutes  Signed:   Aylinn Rydberg M.D. [facility] 06/14/2013, 1:16 PM Pager: [phone]

## 2013-06-14 NOTE — Progress Notes (Signed)
Physical Therapy Treatment Patient Details Name: Sheryl PollenSheryl D Sowers-Delo MRN: [id number] DOB: May 04, 1946 Today's Date: 06/14/2013 Time: [id number] PT Time Calculation (min): 23 min  PT Assessment / Plan / Recommendation  History of Present Illness Patient is a 68 y/o female admitted due to left toe redness and swelling up the leg.  PMH positive for right knee meniscal tear (she reports is due to use of Darco shoe on the left in the past,) impaired glucose tolerance and peripheral neuropathy.   PT Comments   Patient making good progress with mobility and gait with use of RW.  Agree with need for HHPT at discharge.  Follow Up Recommendations  Home health PT;Supervision - Intermittent     Does the patient have the potential to tolerate intense rehabilitation     Barriers to Discharge        Equipment Recommendations  None recommended by PT    Recommendations for Other Services OT consult (May need tub/shower equipment)  Frequency Min 3X/week   Progress towards PT Goals Progress towards PT goals: Progressing toward goals  Plan Current plan remains appropriate    Precautions / Restrictions Precautions Precautions: None Required Braces or Orthoses: Other Brace/Splint Other Brace/Splint: darco shoe left foot (not used due to pt report increses pain right knee and was cause of twisting injury right knee in the past. Restrictions Weight Bearing Restrictions: No   Pertinent Vitals/Pain Pain limiting mobility    Mobility  Bed Mobility Overal bed mobility: Independent Transfers Overall transfer level: Modified independent Equipment used: Rolling walker (2 wheeled) Transfers: Sit to/from Stand Sit to Stand: Modified independent (Device/Increase time) General transfer comment: Patient uses safe technique. Ambulation/Gait Ambulation/Gait assistance: Supervision Ambulation Distance (Feet): 110 Feet Assistive device: Rolling walker (2 wheeled) Gait Pattern/deviations: Step-through  pattern;Decreased stride length;Antalgic Gait velocity: Slow gait speed Gait velocity interpretation: Below normal speed for age/gender General Gait Details: Patient uses safe gait pattern.  Fairly good balance with use of RW.    Exercises     PT Diagnosis:    PT Problem List:   PT Treatment Interventions:     PT Goals (current goals can now be found in the care plan section)    Visit Information  Last PT Received On: 06/14/13 Assistance Needed: +1 History of Present Illness: Patient is a 68 y/o female admitted due to left toe redness and swelling up the leg.  PMH positive for right knee meniscal tear (she reports is due to use of Darco shoe on the left in the past,) impaired glucose tolerance and peripheral neuropathy.    Subjective Data  Subjective: "I can't wear that shoe (Darco)"   Cognition  Cognition Arousal/Alertness: Awake/alert Behavior During Therapy: WFL for tasks assessed/performed Overall Cognitive Status: Within Functional Limits for tasks assessed    Balance     End of Session PT - End of Session Equipment Utilized During Treatment: Gait belt Activity Tolerance: Patient limited by pain;Patient limited by fatigue Patient left: in bed;with call bell/phone within reach Nurse Communication: Mobility status   GP     Vena AustriaDavis, Tyarra Nolton H 06/14/2013, 11:23 AM Durenda HurtSusan H. Renaldo Fiddleravis, PT, [facility] Acute Rehab Services Pager [phone]

## 2013-06-16 LAB — CULTURE, BLOOD (ROUTINE X 2)
CULTURE: NO GROWTH
Culture: NO GROWTH

## 2013-06-18 LAB — NOROVIRUS GROUP 1 & 2 BY PCR, STOOL

## 2013-07-14 ENCOUNTER — Ambulatory Visit (INDEPENDENT_AMBULATORY_CARE_PROVIDER_SITE_OTHER): Payer: Medicare HMO | Admitting: Internal Medicine

## 2013-07-14 ENCOUNTER — Encounter: Payer: Self-pay | Admitting: Internal Medicine

## 2013-07-14 VITALS — BP 110/80 | HR 75 | Temp 98.0°F | Ht 63.5 in | Wt 213.0 lb

## 2013-07-14 DIAGNOSIS — L02619 Cutaneous abscess of unspecified foot: Secondary | ICD-10-CM

## 2013-07-14 DIAGNOSIS — L03032 Cellulitis of left toe: Secondary | ICD-10-CM

## 2013-07-14 DIAGNOSIS — L02612 Cutaneous abscess of left foot: Secondary | ICD-10-CM

## 2013-07-14 DIAGNOSIS — I10 Essential (primary) hypertension: Secondary | ICD-10-CM

## 2013-07-14 DIAGNOSIS — L03039 Cellulitis of unspecified toe: Secondary | ICD-10-CM

## 2013-07-14 DIAGNOSIS — M109 Gout, unspecified: Secondary | ICD-10-CM

## 2013-07-14 MED ORDER — FEBUXOSTAT 40 MG PO TABS: 40.0000 mg | ORAL_TABLET | Freq: Every day | ORAL | Status: DC

## 2013-07-14 NOTE — Assessment & Plan Note (Signed)
Improved, cont colchicine prn, d/c allopurinol, start uloric 40 qd,  to f/u any worsening symptoms or concerns

## 2013-07-14 NOTE — Progress Notes (Signed)
Pre visit review using our clinic review tool, if applicable. No additional management support is needed unless otherwise documented below in the visit note. 

## 2013-07-14 NOTE — Assessment & Plan Note (Signed)
stable overall by history and exam, recent data reviewed with pt, and pt to continue medical treatment as before,  to f/u any worsening symptoms or concerns BP Readings from Last 3 Encounters:  07/14/13 110/80  06/14/13 115/73  09/01/12 124/54

## 2013-07-14 NOTE — Patient Instructions (Addendum)
Ok to stop the allopurinol as you have  Please take all new medication as prescribed - the Uloric at 40 mg per day  Please continue all other medications as before, including finishing the antibiotic Please have the pharmacy call with any other refills you may need.  Please keep your appointments with your specialists as you have planned - Dr Hewitt/ortho  Please return in 6 months, or sooner if needed

## 2013-07-14 NOTE — Assessment & Plan Note (Signed)
Improved, to finish antibx

## 2013-07-14 NOTE — Progress Notes (Signed)
Subjective:    Patient ID: Sheryl Copeland, female    DOB: Dec 07, 1945, 68 y.o.   MRN: [id number]  HPI  Here to f/u after recent acute gouty episode complicated by cellulitis, finishing her antibiotic, no fever, or worsening red/tender/swelling of toe; no drainage, red streaks.  Is adamant she had urinary retention twice with 2 different dose trials of allopurinol so cannot take this.  Colchicine seems to help.  Pt denies chest pain, increased sob or doe, wheezing, orthopnea, PND, increased LE swelling, palpitations, dizziness or syncope. Past Medical History  Diagnosis Date  . Acute meniscal tear of knee sept 2012    right knee  . Arthritis of foot, right     first mtp  . Blood in stool   . Migraine   . Cervical disc disease     s/p cervical spine surgury 1990  . Lumbar disc disease     s/p lumbar fusion 1999  . Allergy   . Asthma   . Hypertension   . Hypothyroidism   . IBS (irritable bowel syndrome) 07/03/2011  . Peripheral neuropathy 07/03/2011  . Impaired glucose tolerance 07/03/2011  . 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 Insomnia 07/15/2011  . Chronic low back pain 07/15/2011  . Hyperlipidemia 07/15/2011  . Restrictive lung disease 07/15/2011   Past Surgical History  Procedure Laterality Date  . Lumbar fusion    . Cholecystectomy  1967  . Appendectomy  1962  . Tonsillectomy  1967  . Abdominal hysterectomy  1974  . Carpal tunnel release      x 2  . Cervical laminectomy    . Hemorrhoid surgery      reports that she has never smoked. She has never used smokeless tobacco. She reports that she does not drink alcohol or use illicit drugs. family history includes Alcohol abuse in her other and other; Arthritis in her other and other; Cancer in her other and other; Diabetes in her other; Heart disease in her other; Hyperlipidemia in her other; Hypertension in her other and other; Mental illness in her other; Stroke in her other. There is no history of Colon  cancer. Allergies  Allergen Reactions  . Aspirin Swelling    Throat closes  . Celebrex [Celecoxib] Hives  . Dipyridamole     Per pt: "opposite effect"  . Eggs Or Egg-Derived Products     diarrhea  . Erythromycin Hives  . Gabapentin     Per pt: uknown  . Iodine Swelling    Throat closes  . Penicillins Diarrhea   Current Outpatient Prescriptions on File Prior to Visit  Medication Sig Dispense Refill  . atenolol (TENORMIN) 50 MG tablet Take 1 tablet (50 mg total) by mouth daily.  90 tablet  3  . BIOTIN PO Take 2 tablets by mouth daily.       . Cholecalciferol (VITAMIN D3) 1000 UNITS CAPS Take 2,000 Units by mouth daily.       . colchicine 0.6 MG tablet Take 1 tablet (0.6 mg total) by mouth daily. Can take twice a day when having acute gout flare  60 tablet  3  . doxycycline (VIBRA-TABS) 100 MG tablet Take 1 tablet (100 mg total) by mouth 2 (two) times daily. X 1 month  60 tablet  0  . hyoscyamine (LEVBID) 0.375 MG 12 hr tablet Take 0.375 mg by mouth every 12 (twelve) hours as needed for cramping.      . indapamide (LOZOL) 2.5 MG tablet Take 1 tablet (  2.5 mg total) by mouth daily.  90 tablet  3  . nabumetone (RELAFEN) 500 MG tablet Take 500 mg by mouth daily.      . pantoprazole (PROTONIX) 40 MG tablet Take 1 tablet (40 mg total) by mouth daily.  30 tablet  3  . predniSONE (DELTASONE) 10 MG tablet Prednisone dosing: Take  Prednisone 40mg  (4 tabs) x 2 days, then taper to 30mg  (3 tabs) x 3 days, then 20mg  (2 tabs) x 3days, then 10mg  (1 tab) x 3days, then OFF.  26 tablet  0  . promethazine (PHENERGAN) 12.5 MG tablet Take 1 tablet (12.5 mg total) by mouth every 6 (six) hours as needed for nausea or vomiting.  30 tablet  0  . silver sulfADIAZINE (SILVADENE) 1 % cream Apply topically daily. Apply to left great toe daily.  50 g  3  . thyroid (ARMOUR) 120 MG tablet Take 120 mg by mouth daily before breakfast.      . traMADol-acetaminophen (ULTRACET) 37.5-325 MG per tablet Take 1-2 tablets by  mouth every 6 (six) hours as needed for severe pain.  60 tablet  0  . HYDROcodone-acetaminophen (NORCO/VICODIN) 5-325 MG per tablet Take 1 tablet by mouth every 6 (six) hours as needed.  30 tablet  0   No current facility-administered medications on file prior to visit.   Review of Systems  Constitutional: Negative for unexpected weight change, or unusual diaphoresis  HENT: Negative for tinnitus.   Eyes: Negative for photophobia and visual disturbance.  Respiratory: Negative for choking and stridor.   Gastrointestinal: Negative for vomiting and blood in stool.  Genitourinary: Negative for hematuria and decreased urine volume.  Musculoskeletal: Negative for acute joint swelling Skin: Negative for color change and wound.  Neurological: Negative for tremors and numbness other than noted  Psychiatric/Behavioral: Negative for decreased concentration or  hyperactivity.       Objective:   Physical Exam BP 110/80  Pulse 75  Temp(Src) 98 F (36.7 C) (Oral)  Ht 5' 3.5" (1.613 m)  Wt 213 lb (96.616 kg)  BMI 37.13 kg/m2  SpO2 97% VS noted,  Constitutional: Pt appears well-developed and well-nourished.  HENT: Head: NCAT.  Right Ear: External ear normal.  Left Ear: External ear normal.  Eyes: Conjunctivae and EOM are normal. Pupils are equal, round, and reactive to light.  Neck: Normal range of motion. Neck supple.  Cardiovascular: Normal rate and regular rhythm.   Pulmonary/Chest: Effort normal and breath sounds normal.  Abd:  Soft, NT, non-distended, + BS Neurological: Pt is alert. Not confused  Skin: Skin is warm. No erythema. left great toe with callous, but no red/tender/swelling/drinage, red streaks Psychiatric: Pt behavior is normal. Thought content normal.     Assessment & Plan:

## 2013-08-18 ENCOUNTER — Encounter: Payer: Self-pay | Admitting: Internal Medicine

## 2013-08-18 MED ORDER — COLCHICINE 0.6 MG PO TABS: 0.6000 mg | ORAL_TABLET | Freq: Every day | ORAL | Status: DC

## 2013-08-18 NOTE — Telephone Encounter (Signed)
Faxed refill to [facility] [place]

## 2013-08-18 NOTE — Telephone Encounter (Signed)
Can refill colchicine, should cont the urolic, and consider OV for persistent symtpoms

## 2013-08-21 ENCOUNTER — Encounter (HOSPITAL_COMMUNITY): Payer: Self-pay | Admitting: Emergency Medicine

## 2013-08-21 ENCOUNTER — Emergency Department (HOSPITAL_COMMUNITY): Payer: Medicare HMO

## 2013-08-21 ENCOUNTER — Emergency Department (HOSPITAL_COMMUNITY)
Admission: EM | Admit: 2013-08-21 | Discharge: 2013-08-22 | Disposition: A | Discharge status: Home / Self Care | Payer: Medicare HMO | Attending: Emergency Medicine | Admitting: Emergency Medicine

## 2013-08-21 DIAGNOSIS — I10 Essential (primary) hypertension: Secondary | ICD-10-CM | POA: Insufficient documentation

## 2013-08-21 DIAGNOSIS — M545 Low back pain, unspecified: Secondary | ICD-10-CM | POA: Insufficient documentation

## 2013-08-21 DIAGNOSIS — J45909 Unspecified asthma, uncomplicated: Secondary | ICD-10-CM | POA: Insufficient documentation

## 2013-08-21 DIAGNOSIS — Z8719 Personal history of other diseases of the digestive system: Secondary | ICD-10-CM | POA: Insufficient documentation

## 2013-08-21 DIAGNOSIS — Z791 Long term (current) use of non-steroidal anti-inflammatories (NSAID): Secondary | ICD-10-CM | POA: Insufficient documentation

## 2013-08-21 DIAGNOSIS — M549 Dorsalgia, unspecified: Secondary | ICD-10-CM

## 2013-08-21 DIAGNOSIS — E039 Hypothyroidism, unspecified: Secondary | ICD-10-CM | POA: Insufficient documentation

## 2013-08-21 DIAGNOSIS — G43909 Migraine, unspecified, not intractable, without status migrainosus: Secondary | ICD-10-CM | POA: Insufficient documentation

## 2013-08-21 DIAGNOSIS — E785 Hyperlipidemia, unspecified: Secondary | ICD-10-CM | POA: Insufficient documentation

## 2013-08-21 DIAGNOSIS — Z79899 Other long term (current) drug therapy: Secondary | ICD-10-CM | POA: Insufficient documentation

## 2013-08-21 DIAGNOSIS — Z880 Allergy status to penicillin: Secondary | ICD-10-CM | POA: Insufficient documentation

## 2013-08-21 DIAGNOSIS — Z87828 Personal history of other (healed) physical injury and trauma: Secondary | ICD-10-CM | POA: Insufficient documentation

## 2013-08-21 DIAGNOSIS — G8929 Other chronic pain: Secondary | ICD-10-CM | POA: Insufficient documentation

## 2013-08-21 DIAGNOSIS — Z9889 Other specified postprocedural states: Secondary | ICD-10-CM | POA: Insufficient documentation

## 2013-08-21 DIAGNOSIS — M19079 Primary osteoarthritis, unspecified ankle and foot: Secondary | ICD-10-CM | POA: Insufficient documentation

## 2013-08-21 MED ORDER — OXYCODONE-ACETAMINOPHEN 5-325 MG PO TABS
2.0000 | ORAL_TABLET | Freq: Once | ORAL | Status: AC
  Administered 2013-08-22: 2 via ORAL
  Filled 2013-08-21: qty 2

## 2013-08-21 MED ORDER — DIAZEPAM 5 MG PO TABS
5.0000 mg | ORAL_TABLET | Freq: Once | ORAL | Status: DC
  Filled 2013-08-21: qty 1

## 2013-08-21 NOTE — ED Provider Notes (Signed)
CSN: [id number]     Arrival date & time 08/21/13  2221 History  This chart was scribed for non-physician practitioner Junious SilkHannah Sevan Mcbroom, PA-C working with Shelda JakesScott W. Zackowski, MD by Joaquin MusicKristina Sanchez-Matthews, ED Scribe. This patient was seen in room TR09C/TR09C and the patient's care was started at 11:31 PM .   Chief Complaint  Patient presents with  . Back Pain  . Leg Pain   The history is provided by the patient. No language interpreter was used.   HPI Comments: Sheryl Copeland is a 68 y.o. female who presents to the Emergency Department complaining of ongoing lower back that radiates to bilateral legs that began yesterday . She states she bent over and felt a sharp sudden pain come from her lower back that she states felt like a muscle spasm. Pt states she began having sharp pain today with shooting pain. She reports using heat/ice and vibrator to lower back and denies having relief. Pt states "she has a cage in her back and states she now has neuropathy as a result". She reports having a herniated disc surgically repaired in 1998 by Dr. Danielle DessElsner. Pt states she has taken a Hydrocodone with some relief. Pt denies bowel and bladder incontinence, tingling, numbness, hx of cancer, IVDA.  Past Medical History  Diagnosis Date  . Acute meniscal tear of knee sept 2012    right knee  . Arthritis of foot, right     first mtp  . Blood in stool   . Migraine   . Cervical disc disease     s/p cervical spine surgury 1990  . Lumbar disc disease     s/p lumbar fusion 1999  . Allergy   . Asthma   . Hypertension   . Hypothyroidism   . IBS (irritable bowel syndrome) 07/03/2011  . Peripheral neuropathy 07/03/2011  . Impaired glucose tolerance 07/03/2011  . 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 Insomnia 07/15/2011  . Chronic low back pain 07/15/2011  . Hyperlipidemia 07/15/2011  . Restrictive lung disease 07/15/2011   Past Surgical History  Procedure Laterality Date  . Lumbar fusion    .  Cholecystectomy  1967  . Appendectomy  1962  . Tonsillectomy  1967  . Abdominal hysterectomy  1974  . Carpal tunnel release      x 2  . Cervical laminectomy    . Hemorrhoid surgery     Family History  Problem Relation Age of Onset  . Alcohol abuse Other   . Cancer Other     breast cancer  . Stroke Other   . Hypertension Other   . Mental illness Other   . Arthritis Other   . Alcohol abuse Other   . Arthritis Other   . Cancer Other     prostate cancer and breast cancer  . Hyperlipidemia Other   . Heart disease Other   . Hypertension Other   . Diabetes Other   . Colon cancer Neg Hx    History  Substance Use Topics  . Smoking status: Never Smoker   . Smokeless tobacco: Never Used  . Alcohol Use: No   OB History   Grav Para Term Preterm Abortions TAB SAB Ect Mult Living                 Review of Systems  Musculoskeletal: Positive for back pain.  All other systems reviewed and are negative.  Allergies  Aspirin; Celebrex; Dipyridamole; Eggs or egg-derived products; Erythromycin; Gabapentin; Iodine; and Penicillins  Home  Medications   Prior to Admission medications   Medication Sig Start Date End Date Taking? Authorizing Provider  atenolol (TENORMIN) 50 MG tablet Take 1 tablet (50 mg total) by mouth daily. 08/05/12  Yes Corwin Levins, MD  BIOTIN PO Take 2 tablets by mouth daily.    Yes Historical Provider, MD  Cholecalciferol (VITAMIN D3) 1000 UNITS CAPS Take 2,000 Units by mouth daily.    Yes Historical Provider, MD  colchicine 0.6 MG tablet Take 1 tablet (0.6 mg total) by mouth daily. Can take twice a day when having acute gout flare 08/18/13  Yes Corwin Levins, MD  HYDROcodone-acetaminophen (NORCO/VICODIN) 5-325 MG per tablet Take 1 tablet by mouth every 6 (six) hours as needed for moderate pain.   Yes Historical Provider, MD  indapamide (LOZOL) 2.5 MG tablet Take 1 tablet (2.5 mg total) by mouth daily. 08/05/12  Yes Corwin Levins, MD  nabumetone (RELAFEN) 500 MG tablet  Take 500 mg by mouth daily.   Yes Historical Provider, MD  silver sulfADIAZINE (SILVADENE) 1 % cream Apply topically daily. Apply to left great toe daily. 06/14/13  Yes Ripudeep Jenna Luo, MD  thyroid (ARMOUR) 120 MG tablet Take 120 mg by mouth daily before breakfast.   Yes Historical Provider, MD   BP 139/63  Pulse 66  Temp(Src) 98.9 F (37.2 C) (Oral)  Resp 18  SpO2 92%  Physical Exam  Nursing note and vitals reviewed. Constitutional: She is oriented to person, place, and time. She appears well-developed and well-nourished. No distress.  HENT:  Head: Normocephalic and atraumatic.  Right Ear: External ear normal.  Left Ear: External ear normal.  Nose: Nose normal.  Mouth/Throat: Oropharynx is clear and moist.  Eyes: Conjunctivae and EOM are normal.  Neck: Normal range of motion. Neck supple. No tracheal deviation present.  Cardiovascular: Normal rate, regular rhythm, normal heart sounds, intact distal pulses and normal pulses.   Pulses:      Radial pulses are 2+ on the right side, and 2+ on the left side.       Posterior tibial pulses are 2+ on the right side, and 2+ on the left side.  Pulmonary/Chest: Effort normal and breath sounds normal. No stridor. No respiratory distress. She has no wheezes. She has no rales.  Abdominal: Soft. She exhibits no distension.  Musculoskeletal: Normal range of motion.  Logroll test positive to R. Straight leg raise positive on R. Tender to palpation over lumbar spine. No deformities or step-offs.   Neurological: She is alert and oriented to person, place, and time. She has normal strength.  Skin: Skin is warm and dry. She is not diaphoretic. No erythema.  Psychiatric: She has a normal mood and affect. Her behavior is normal.    ED Course  Procedures  DIAGNOSTIC STUDIES: Oxygen Saturation is 92% on RA, normal by my interpretation.    COORDINATION OF CARE: 11:36 PM-Discussed treatment plan which includes X-ray. Pt agreed to plan.   Labs  Review Labs Reviewed - No data to display  Imaging Review Dg Lumbar Spine Complete  08/22/2013   CLINICAL DATA:  Sudden onset radiating back pain and right hip pain after bending over yesterday. History of lumbar fusion.  EXAM: LUMBAR SPINE - COMPLETE 4+ VIEW  COMPARISON:  CT ABDOMEN W/O CM dated 08/05/2006; DG ABDOMEN 1V dated 08/08/2004  FINDINGS: Postoperative changes with posterior rod and screw fixation and intervertebral prosthetic fusion at L4-5. Normal alignment. Degenerative disc disease at L3-4. No vertebral compression deformities. No focal  bone lesion or bone destruction.  IMPRESSION: Postoperative changes with L4-5 fusion. No acute bony abnormalities.   Electronically Signed   By: Burman NievesWilliam  Stevens M.D.   On: 08/22/2013 00:31   Dg Hip Complete Right  08/22/2013   CLINICAL DATA:  Sudden onset back pain radiating to the right hip after bending over yesterday.  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 Mild degenerative changes in both hips. There is focal lobular calcification projected over the proximal right femur. This may be dystrophic soft tissue calcification. Changes were not seen on previous CT scan from 2008. There is no evidence of cortical involvement or periosteal reaction in the changes are likely benign. No acute fractures demonstrated.  IMPRESSION: No acute bony abnormalities.   Electronically Signed   By: Burman NievesWilliam  Stevens M.D.   On: 08/22/2013 00:34     EKG Interpretation None     MDM   Final diagnoses:  Back pain    Patient with back pain.  No neurological deficits and normal neuro exam.  Patient can walk but states is painful.  No loss of bowel or bladder control.  No concern for cauda equina.  No fever, night sweats, weight loss,  h/o cancer, IVDU.  RICE protocol and pain medicine indicated and discussed with patient.    I personally performed the services described in this documentation, which was scribed in my presence. The recorded information has been reviewed and is accurate.     Mora BellmanHannah S Tripton Ned, PA-C 08/22/13 [phone]

## 2013-08-21 NOTE — ED Notes (Addendum)
Per EMS: pt was in her garden yesterday working, hurt her back and right leg while standing up. Pt took a hydrocodone today around 3 pm with no relief. Pt is A&Ox4, respirations equal and unlabored, skin warm and dry. Pain 10/10. Pt was ambulatory to stretcher

## 2013-08-22 MED ORDER — ONDANSETRON HCL 4 MG PO TABS: 4.0000 mg | ORAL_TABLET | Freq: Four times a day (QID) | ORAL | Status: DC

## 2013-08-22 MED ORDER — OXYCODONE-ACETAMINOPHEN 5-325 MG PO TABS: 1.0000 | ORAL_TABLET | Freq: Four times a day (QID) | ORAL | Status: DC | PRN

## 2013-08-22 MED ORDER — CYCLOBENZAPRINE HCL 10 MG PO TABS: 10.0000 mg | ORAL_TABLET | Freq: Two times a day (BID) | ORAL | Status: DC | PRN

## 2013-08-22 NOTE — Discharge Instructions (Signed)
Back Pain, Adult °Back pain is very common. The pain often gets better over time. The cause of back pain is usually not dangerous. Most people can learn to manage their back pain on their own.  °HOME CARE  °· Stay active. Start with short walks on flat ground if you can. Try to walk farther each day. °· Do not sit, drive, or stand in one place for more than 30 minutes. Do not stay in bed. °· Do not avoid exercise or work. Activity can help your back heal faster. °· Be careful when you bend or lift an object. Bend at your knees, keep the object close to you, and do not twist. °· Sleep on a firm mattress. Lie on your side, and bend your knees. If you lie on your back, put a pillow under your knees. °· Only take medicines as told by your doctor. °· Put ice on the injured area. °· Put ice in a plastic bag. °· Place a towel between your skin and the bag. °· Leave the ice on for 15-20 minutes, 03-04 times a day for the first 2 to 3 days. After that, you can switch between ice and heat packs. °· Ask your doctor about back exercises or massage. °· Avoid feeling anxious or stressed. Find good ways to deal with stress, such as exercise. °GET HELP RIGHT AWAY IF:  °· Your pain does not go away with rest or medicine. °· Your pain does not go away in 1 week. °· You have new problems. °· You do not feel well. °· The pain spreads into your legs. °· You cannot control when you poop (bowel movement) or pee (urinate). °· Your arms or legs feel weak or lose feeling (numbness). °· You feel sick to your stomach (nauseous) or throw up (vomit). °· You have belly (abdominal) pain. °· You feel like you may pass out (faint). °MAKE SURE YOU:  °· Understand these instructions. °· Will watch your condition. °· Will get help right away if you are not doing well or get worse. °Document Released: 10/10/2007 Document Revised: 07/16/2011 Document Reviewed: 09/11/2010 °[employer]® Patient Information ©2014 [employer]. ° °Back Exercises °Back  exercises help treat and prevent back injuries. The goal is to increase your strength in your belly (abdominal) and back muscles. These exercises can also help with flexibility. Start these exercises when told by your doctor. °HOME CARE °Back exercises include: °Pelvic Tilt. °· Lie on your back with your knees bent. Tilt your pelvis until the lower part of your back is against the floor. Hold this position 5 to 10 sec. Repeat this exercise 5 to 10 times. °Knee to Chest. °· Pull 1 knee up against your chest and hold for 20 to 30 seconds. Repeat this with the other knee. This may be done with the other leg straight or bent, whichever feels better. Then, pull both knees up against your chest. °Sit-Ups or Curl-Ups. °· Bend your knees 90 degrees. Start with tilting your pelvis, and do a partial, slow sit-up. Only lift your upper half 30 to 45 degrees off the floor. Take at least 2 to 3 seonds for each sit-up. Do not do sit-ups with your knees out straight. If partial sit-ups are difficult, simply do the above but with only tightening your belly (abdominal) muscles and holding it as told. °Hip-Lift. °· Lie on your back with your knees flexed 90 degrees. Push down with your feet and shoulders as you raise your hips 2 inches off the   floor. Hold for 10 seconds, repeat 5 to 10 times. °Back Arches. °· Lie on your stomach. Prop yourself up on bent elbows. Slowly press on your hands, causing an arch in your low back. Repeat 3 to 5 times. °Shoulder-Lifts. °· Lie face down with arms beside your body. Keep hips and belly pressed to floor as you slowly lift your head and shoulders off the floor. °Do not overdo your exercises. Be careful in the beginning. Exercises may cause you some mild back discomfort. If the pain lasts for more than 15 minutes, stop the exercises until you see your doctor. Improvement with exercise for back problems is slow.  °Document Released: 05/26/2010 Document Revised: 07/16/2011 Document Reviewed:  02/22/2011 °[employer]® Patient Information ©2014 [employer]. ° °

## 2013-08-23 NOTE — ED Provider Notes (Addendum)
Medical screening examination/treatment/procedure(s) were conducted as a shared visit with non-physician practitioner(s) and myself.  I personally evaluated the patient during the encounter.   EKG Interpretation None      The patient seen as a shared visit. 68 year old female with complaining ongoing lower back pain that radiates to both legs that began yesterday. This occurred when she bent over. Pain onset was sudden.  Results for orders placed during the hospital encounter of 06/09/13  CULTURE, BLOOD (ROUTINE X 2)      Result Value Ref Range   Specimen Description BLOOD LEFT HAND     Special Requests BOTTLES DRAWN AEROBIC ONLY 5CC     Culture  Setup Time       Value: 06/10/2013 03:31     Performed at [facility]   Culture       Value: NO GROWTH 5 DAYS     Performed at [facility]   Report Status 06/16/2013 FINAL    CULTURE, BLOOD (ROUTINE X 2)      Result Value Ref Range   Specimen Description BLOOD LEFT FOREARM     Special Requests BOTTLES DRAWN AEROBIC AND ANAEROBIC 5CC     Culture  Setup Time       Value: 06/10/2013 03:31     Performed at [facility]   Culture       Value: NO GROWTH 5 DAYS     Performed at [facility]   Report Status 06/16/2013 FINAL    CBC WITH DIFFERENTIAL      Result Value Ref Range   WBC 11.1 (*) 4.0 - 10.5 K/uL   RBC 4.65  3.87 - 5.11 MIL/uL   Hemoglobin 14.9  12.0 - 15.0 g/dL   HCT 16.140.6  09.636.0 - 04.546.0 %   MCV 87.3  78.0 - 100.0 fL   MCH 32.0  26.0 - 34.0 pg   MCHC 36.7 (*) 30.0 - 36.0 g/dL   RDW 40.912.3  81.111.5 - 91.415.5 %   Platelets 172  150 - 400 K/uL   Neutrophils Relative % 64  43 - 77 %   Neutro Abs 7.1  1.7 - 7.7 K/uL   Lymphocytes Relative 19  12 - 46 %   Lymphs Abs 2.2  0.7 - 4.0 K/uL   Monocytes Relative 14 (*) 3 - 12 %   Monocytes Absolute 1.6 (*) 0.1 - 1.0 K/uL   Eosinophils Relative 3  0 - 5 %   Eosinophils Absolute 0.3  0.0 - 0.7 K/uL   Basophils Relative 0  0 - 1 %   Basophils Absolute 0.0  0.0 -  0.1 K/uL  COMPREHENSIVE METABOLIC PANEL      Result Value Ref Range   Sodium 135 (*) 137 - 147 mEq/L   Potassium 3.9  3.7 - 5.3 mEq/L   Chloride 95 (*) 96 - 112 mEq/L   CO2 24  19 - 32 mEq/L   Glucose, Bld 130 (*) 70 - 99 mg/dL   BUN 29 (*) 6 - 23 mg/dL   Creatinine, Ser 7.821.29 (*) 0.50 - 1.10 mg/dL   Calcium 9.4  8.4 - 95.610.5 mg/dL   Total Protein 8.2  6.0 - 8.3 g/dL   Albumin 3.8  3.5 - 5.2 g/dL   AST 31  0 - 37 U/L   ALT 36 (*) 0 - 35 U/L   Alkaline Phosphatase 56  39 - 117 U/L   Total Bilirubin 0.5  0.3 - 1.2 mg/dL  GFR calc non Af Amer 42 (*) >90 mL/min   GFR calc Af Amer 48 (*) >90 mL/min  BASIC METABOLIC PANEL      Result Value Ref Range   Sodium 140  137 - 147 mEq/L   Potassium 3.6 (*) 3.7 - 5.3 mEq/L   Chloride 103  96 - 112 mEq/L   CO2 24  19 - 32 mEq/L   Glucose, Bld 119 (*) 70 - 99 mg/dL   BUN 25 (*) 6 - 23 mg/dL   Creatinine, Ser 1.611.16 (*) 0.50 - 1.10 mg/dL   Calcium 8.6  8.4 - 09.610.5 mg/dL   GFR calc non Af Amer 47 (*) >90 mL/min   GFR calc Af Amer 55 (*) >90 mL/min  CBC      Result Value Ref Range   WBC 10.1  4.0 - 10.5 K/uL   RBC 3.87  3.87 - 5.11 MIL/uL   Hemoglobin 11.9 (*) 12.0 - 15.0 g/dL   HCT 04.533.9 (*) 40.936.0 - 81.146.0 %   MCV 87.6  78.0 - 100.0 fL   MCH 30.7  26.0 - 34.0 pg   MCHC 35.1  30.0 - 36.0 g/dL   RDW 91.412.3  78.211.5 - 95.615.5 %   Platelets 152  150 - 400 K/uL  NOROVIRUS GROUP 1 & 2 BY PCR, STOOL      Result Value Ref Range   Norovirus RNA, RT, PCR REPORT    URIC ACID      Result Value Ref Range   Uric Acid, Serum 9.9 (*) 2.4 - 7.0 mg/dL  VITAMIN [id number]      Result Value Ref Range   Vitamin B-12 528  211 - 911 pg/mL  FOLATE      Result Value Ref Range   Folate >20.0    IRON AND TIBC      Result Value Ref Range   Iron 27 (*) 42 - 135 ug/dL   TIBC 086232 (*) 578250 - 469470 ug/dL   Saturation Ratios 12 (*) 20 - 55 %   UIBC 205  125 - 400 ug/dL  FERRITIN      Result Value Ref Range   Ferritin 478 (*) 10 - 291 ng/mL  RETICULOCYTES      Result Value Ref Range    Retic Ct Pct 1.4  0.4 - 3.1 %   RBC. 4.32  3.87 - 5.11 MIL/uL   Retic Count, Manual 60.5  19.0 - 186.0 K/uL  HEMOGLOBIN A1C      Result Value Ref Range   Hemoglobin A1C 5.9 (*) <5.7 %   Mean Plasma Glucose 123 (*) <117 mg/dL  BASIC METABOLIC PANEL      Result Value Ref Range   Sodium 138  137 - 147 mEq/L   Potassium 4.0  3.7 - 5.3 mEq/L   Chloride 100  96 - 112 mEq/L   CO2 25  19 - 32 mEq/L   Glucose, Bld 109 (*) 70 - 99 mg/dL   BUN 20  6 - 23 mg/dL   Creatinine, Ser 6.291.04  0.50 - 1.10 mg/dL   Calcium 8.9  8.4 - 52.810.5 mg/dL   GFR calc non Af Amer 54 (*) >90 mL/min   GFR calc Af Amer 63 (*) >90 mL/min  GLUCOSE, CAPILLARY      Result Value Ref Range   Glucose-Capillary 123 (*) 70 - 99 mg/dL  GLUCOSE, CAPILLARY      Result Value Ref Range   Glucose-Capillary 114 (*) 70 - 99 mg/dL  BASIC METABOLIC PANEL      Result Value Ref Range   Sodium 137  137 - 147 mEq/L   Potassium 4.6  3.7 - 5.3 mEq/L   Chloride 96  96 - 112 mEq/L   CO2 24  19 - 32 mEq/L   Glucose, Bld 123 (*) 70 - 99 mg/dL   BUN 30 (*) 6 - 23 mg/dL   Creatinine, Ser 6.29 (*) 0.50 - 1.10 mg/dL   Calcium 9.4  8.4 - 52.8 mg/dL   GFR calc non Af Amer 48 (*) >90 mL/min   GFR calc Af Amer 56 (*) >90 mL/min  CBC      Result Value Ref Range   WBC 12.1 (*) 4.0 - 10.5 K/uL   RBC 4.52  3.87 - 5.11 MIL/uL   Hemoglobin 14.3  12.0 - 15.0 g/dL   HCT 41.3  24.4 - 01.0 %   MCV 86.9  78.0 - 100.0 fL   MCH 31.6  26.0 - 34.0 pg   MCHC 36.4 (*) 30.0 - 36.0 g/dL   RDW 27.2  53.6 - 64.4 %   Platelets 268  150 - 400 K/uL   Dg Lumbar Spine Complete  08/22/2013   CLINICAL DATA:  Sudden onset radiating back pain and right hip pain after bending over yesterday. History of lumbar fusion.  EXAM: LUMBAR SPINE - COMPLETE 4+ VIEW  COMPARISON:  CT ABDOMEN W/O CM dated 08/05/2006; DG ABDOMEN 1V dated 08/08/2004  FINDINGS: Postoperative changes with posterior rod and screw fixation and intervertebral prosthetic fusion at L4-5. Normal alignment.  Degenerative disc disease at L3-4. No vertebral compression deformities. No focal bone lesion or bone destruction.  IMPRESSION: Postoperative changes with L4-5 fusion. No acute bony abnormalities.   Electronically Signed   By: Burman Nieves M.D.   On: 08/22/2013 00:31   Dg Hip Complete Right  08/22/2013   CLINICAL DATA:  Sudden onset back pain radiating to the right hip after bending over yesterday.  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 Mild degenerative changes in both hips. There is focal lobular calcification projected over the proximal right femur. This may be dystrophic soft tissue calcification. Changes were not seen on previous CT scan from 2008. There is no evidence of cortical involvement or periosteal reaction in the changes are likely benign. No acute fractures demonstrated.  IMPRESSION: No acute bony abnormalities.   Electronically Signed   By: Burman Nieves M.D.   On: 08/22/2013 00:34    No acute findings on the x-rays. Patient can be discharged for followup.  in addition no acute focal neurological findings on exam.  Shelda Jakes, MD 08/23/13 1520  Shelda Jakes, MD 08/23/13 [phone]

## 2013-09-09 ENCOUNTER — Other Ambulatory Visit: Payer: Self-pay

## 2013-09-09 ENCOUNTER — Encounter: Payer: Self-pay | Admitting: Internal Medicine

## 2013-09-09 MED ORDER — TRAMADOL HCL 50 MG PO TABS: 50.0000 mg | ORAL_TABLET | Freq: Three times a day (TID) | ORAL | Status: DC | PRN

## 2013-09-09 MED ORDER — ATENOLOL 50 MG PO TABS: 50.0000 mg | ORAL_TABLET | Freq: Every day | ORAL | Status: DC

## 2013-09-09 MED ORDER — NABUMETONE 500 MG PO TABS: 500.0000 mg | ORAL_TABLET | Freq: Every day | ORAL | Status: DC

## 2013-09-09 MED ORDER — INDAPAMIDE 2.5 MG PO TABS: 2.5000 mg | ORAL_TABLET | Freq: Every day | ORAL | Status: DC

## 2013-09-09 MED ORDER — THYROID 120 MG PO TABS: 120.0000 mg | ORAL_TABLET | Freq: Every day | ORAL | Status: DC

## 2013-09-09 NOTE — Telephone Encounter (Signed)
Tramadol - Done hardcopy to [facility]  Robin to see re: rest of refills

## 2013-09-09 NOTE — Telephone Encounter (Signed)
Faxed hardcopy of tramadol to [facility].

## 2013-09-24 ENCOUNTER — Other Ambulatory Visit: Payer: Self-pay

## 2013-09-24 ENCOUNTER — Encounter: Payer: Self-pay | Admitting: Internal Medicine

## 2013-09-24 ENCOUNTER — Telehealth: Payer: Self-pay

## 2013-09-24 MED ORDER — ATENOLOL 50 MG PO TABS: 50.0000 mg | ORAL_TABLET | Freq: Every day | ORAL | Status: DC

## 2013-09-24 NOTE — Telephone Encounter (Signed)
It appears this medication was stopped at last hosp d/c feb 2015

## 2013-09-24 NOTE — Telephone Encounter (Signed)
Received an email request for a refill for Quinapril to send to [facility], but this medication is not on her current list, advise please.

## 2013-09-25 NOTE — Telephone Encounter (Signed)
Sent patient an email on message from MD.

## 2013-10-14 ENCOUNTER — Ambulatory Visit (INDEPENDENT_AMBULATORY_CARE_PROVIDER_SITE_OTHER): Payer: Commercial Managed Care - HMO | Admitting: Internal Medicine

## 2013-10-14 ENCOUNTER — Encounter: Payer: Self-pay | Admitting: Internal Medicine

## 2013-10-14 VITALS — BP 112/76 | HR 75 | Temp 98.2°F | Wt 217.1 lb

## 2013-10-14 DIAGNOSIS — R600 Localized edema: Secondary | ICD-10-CM | POA: Insufficient documentation

## 2013-10-14 DIAGNOSIS — J4520 Mild intermittent asthma, uncomplicated: Secondary | ICD-10-CM

## 2013-10-14 DIAGNOSIS — R609 Edema, unspecified: Secondary | ICD-10-CM | POA: Insufficient documentation

## 2013-10-14 DIAGNOSIS — I10 Essential (primary) hypertension: Secondary | ICD-10-CM

## 2013-10-14 DIAGNOSIS — J45909 Unspecified asthma, uncomplicated: Secondary | ICD-10-CM

## 2013-10-14 MED ORDER — FUROSEMIDE 20 MG PO TABS: 20.0000 mg | ORAL_TABLET | Freq: Every day | ORAL | Status: DC

## 2013-10-14 MED ORDER — ALLOPURINOL 300 MG PO TABS: 300.0000 mg | ORAL_TABLET | Freq: Every day | ORAL | Status: DC

## 2013-10-14 NOTE — Assessment & Plan Note (Signed)
stable overall by history and exam, recent data reviewed with pt, and pt to continue medical treatment as before,  to f/u any worsening symptoms or concerns BP Readings from Last 3 Encounters:  10/14/13 112/76  08/22/13 111/60  07/14/13 110/80

## 2013-10-14 NOTE — Progress Notes (Signed)
Pre visit review using our clinic review tool, if applicable. No additional management support is needed unless otherwise documented below in the visit note. 

## 2013-10-14 NOTE — Assessment & Plan Note (Addendum)
Most likely c/w venous insufficiency left > right due to recent healed saphenous superfic phlebitis,, ok to change indapamide, to lasix 20 qd,  Leg elevation, low salt, compression stockings (refuses), to f/u any worsening symptoms or concerns, consider further labs, echo, cxr if not improved or worsening, consider PFT's with hx of restrictive lung dz, advised wt loss

## 2013-10-14 NOTE — Progress Notes (Signed)
Subjective:    Patient ID: Sheryl Copeland, female    DOB: [birth year], 68 y.o.   MRN: [id number]  HPI  Here to f/u, has ongoing LE swelling worse left > right since episode of distal superfic phlebitis below the knee feb 2015, worse in last few wks.  Indapamide not working.  Pt denies chest pain, wheezing, orthopnea, PND,  palpitations, dizziness or syncope.  Some improvement overnight, worse again by evening next day, makes feet quite uncomfortable.  Recent gout improved with taking allopurinol/colchicine Past Medical History  Diagnosis Date  . Acute meniscal tear of knee sept 2012    right knee  . Arthritis of foot, right     first mtp  . Blood in stool   . Migraine   . Cervical disc disease     s/p cervical spine surgury 1990  . Lumbar disc disease     s/p lumbar fusion 1999  . Allergy   . Asthma   . Hypertension   . Hypothyroidism   . IBS (irritable bowel syndrome) 07/03/2011  . Peripheral neuropathy 07/03/2011  . Impaired glucose tolerance 07/03/2011  . 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 Insomnia 07/15/2011  . Chronic low back pain 07/15/2011  . Hyperlipidemia 07/15/2011  . Restrictive lung disease 07/15/2011   Past Surgical History  Procedure Laterality Date  . Lumbar fusion    . Cholecystectomy  1967  . Appendectomy  1962  . Tonsillectomy  1967  . Abdominal hysterectomy  1974  . Carpal tunnel release      x 2  . Cervical laminectomy    . Hemorrhoid surgery      reports that she has never smoked. She has never used smokeless tobacco. She reports that she does not drink alcohol or use illicit drugs. family history includes Alcohol abuse in her other and other; Arthritis in her other and other; Cancer in her other and other; Diabetes in her other; Heart disease in her other; Hyperlipidemia in her other; Hypertension in her other and other; Mental illness in her other; Stroke in her other. There is no history of Colon cancer. Allergies  Allergen Reactions    . Aspirin Swelling    Throat closes  . Celebrex [Celecoxib] Hives  . Dipyridamole     Per pt: "opposite effect"  . Eggs Or Egg-Derived Products     diarrhea  . Erythromycin Hives  . Gabapentin     Per pt: uknown  . Iodine Swelling    Throat closes  . Penicillins Diarrhea   Current Outpatient Prescriptions on File Prior to Visit  Medication Sig Dispense Refill  . atenolol (TENORMIN) 50 MG tablet Take 1 tablet (50 mg total) by mouth daily.  30 tablet  1  . BIOTIN PO Take 2 tablets by mouth daily.       . Cholecalciferol (VITAMIN D3) 1000 UNITS CAPS Take 2,000 Units by mouth daily.       . colchicine 0.6 MG tablet Take 1 tablet (0.6 mg total) by mouth daily. Can take twice a day when having acute gout flare  60 tablet  11  . cyclobenzaprine (FLEXERIL) 10 MG tablet Take 1 tablet (10 mg total) by mouth 2 (two) times daily as needed for muscle spasms.  20 tablet  0  . nabumetone (RELAFEN) 500 MG tablet Take 1 tablet (500 mg total) by mouth daily.  90 tablet  3  . ondansetron (ZOFRAN) 4 MG tablet Take 1 tablet (4 mg total) by mouth  every 6 (six) hours.  12 tablet  0  . silver sulfADIAZINE (SILVADENE) 1 % cream Apply topically daily. Apply to left great toe daily.  50 g  3  . thyroid (ARMOUR) 120 MG tablet Take 1 tablet (120 mg total) by mouth daily before breakfast.  90 tablet  3  . traMADol (ULTRAM) 50 MG tablet Take 1 tablet (50 mg total) by mouth every 8 (eight) hours as needed.  270 tablet  1   No current facility-administered medications on file prior to visit.   Review of Systems  Constitutional: Negative for unusual diaphoresis or other sweats  HENT: Negative for ringing in ear Eyes: Negative for double vision or worsening visual disturbance.  Respiratory: Negative for choking and stridor.   Gastrointestinal: Negative for vomiting or other signifcant bowel change Genitourinary: Negative for hematuria or decreased urine volume.  Musculoskeletal: Negative for other MSK pain or  swelling Skin: Negative for color change and worsening wound.  Neurological: Negative for tremors and numbness other than noted  Psychiatric/Behavioral: Negative for decreased concentration or agitation other than above       Objective:   Physical Exam BP 112/76  Pulse 75  Temp(Src) 98.2 F (36.8 C) (Oral)  Wt 217 lb 2 oz (98.487 kg)  SpO2 95% VS noted,  Constitutional: Pt appears well-developed, well-nourished.  HENT: Head: NCAT.  Right Ear: External ear normal.  Left Ear: External ear normal.  Eyes: . Pupils are equal, round, and reactive to light. Conjunctivae and EOM are normal Neck: Normal range of motion. Neck supple.  Cardiovascular: Normal rate and regular rhythm.   Pulmonary/Chest: Effort normal and breath sounds normal. - no rales or wheezing Neurological: Pt is alert. Not confused , motor grossly intact Skin: Skin is warm. No rash, trace to 1+ edema to mid legs , left > right, non tender Psychiatric: Pt behavior is normal. No agitation.     Assessment & Plan:

## 2013-10-14 NOTE — Patient Instructions (Signed)
OK to stop the indapamide fluid pill  Please take all new medication as prescribed - the lasix fluid pill  Please continue all other medications as before, including the colchicine, and the allopurinol  Please have the pharmacy call with any other refills you may need.  Please keep your appointments with your specialists as you may have planned

## 2013-10-14 NOTE — Assessment & Plan Note (Signed)
stable overall by history and exam, recent data reviewed with pt, and pt to continue medical treatment as before,  to f/u any worsening symptoms or concerns SpO2 Readings from Last 3 Encounters:  10/14/13 95%  08/21/13 92%  07/14/13 97%

## 2013-11-10 ENCOUNTER — Encounter: Payer: Self-pay | Admitting: Internal Medicine

## 2013-11-10 NOTE — Telephone Encounter (Signed)
Sheryl Copeland to see above, to call pt - ok for OV next available

## 2013-11-17 ENCOUNTER — Encounter: Payer: Self-pay | Admitting: Internal Medicine

## 2013-11-17 ENCOUNTER — Ambulatory Visit (INDEPENDENT_AMBULATORY_CARE_PROVIDER_SITE_OTHER): Payer: Commercial Managed Care - HMO | Admitting: Internal Medicine

## 2013-11-17 ENCOUNTER — Other Ambulatory Visit (INDEPENDENT_AMBULATORY_CARE_PROVIDER_SITE_OTHER): Payer: Commercial Managed Care - HMO

## 2013-11-17 VITALS — BP 112/70 | HR 71 | Temp 98.4°F | Ht 63.5 in | Wt 217.5 lb

## 2013-11-17 DIAGNOSIS — R7302 Impaired glucose tolerance (oral): Secondary | ICD-10-CM

## 2013-11-17 DIAGNOSIS — R7309 Other abnormal glucose: Secondary | ICD-10-CM

## 2013-11-17 DIAGNOSIS — Z0000 Encounter for general adult medical examination without abnormal findings: Secondary | ICD-10-CM

## 2013-11-17 DIAGNOSIS — Z23 Encounter for immunization: Secondary | ICD-10-CM

## 2013-11-17 LAB — URINALYSIS, ROUTINE W REFLEX MICROSCOPIC
Bilirubin Urine: NEGATIVE
Ketones, ur: NEGATIVE
Leukocytes, UA: NEGATIVE
NITRITE: NEGATIVE
SPECIFIC GRAVITY, URINE: 1.015 (ref 1.000–1.030)
TOTAL PROTEIN, URINE-UPE24: NEGATIVE
Urine Glucose: NEGATIVE
Urobilinogen, UA: 0.2 (ref 0.0–1.0)
pH: 5.5 (ref 5.0–8.0)

## 2013-11-17 LAB — CBC WITH DIFFERENTIAL/PLATELET
Basophils Absolute: 0 10*3/uL (ref 0.0–0.1)
Basophils Relative: 0.2 % (ref 0.0–3.0)
EOS PCT: 6.4 % — AB (ref 0.0–5.0)
Eosinophils Absolute: 0.6 10*3/uL (ref 0.0–0.7)
HCT: 39 % (ref 36.0–46.0)
Hemoglobin: 13.1 g/dL (ref 12.0–15.0)
LYMPHS PCT: 26.8 % (ref 12.0–46.0)
Lymphs Abs: 2.6 10*3/uL (ref 0.7–4.0)
MCHC: 33.6 g/dL (ref 30.0–36.0)
MCV: 92.7 fl (ref 78.0–100.0)
MONO ABS: 1.1 10*3/uL — AB (ref 0.1–1.0)
Monocytes Relative: 10.8 % (ref 3.0–12.0)
Neutro Abs: 5.5 10*3/uL (ref 1.4–7.7)
Neutrophils Relative %: 55.8 % (ref 43.0–77.0)
Platelets: 192 10*3/uL (ref 150.0–400.0)
RBC: 4.21 Mil/uL (ref 3.87–5.11)
RDW: 13 % (ref 11.5–15.5)
WBC: 9.9 10*3/uL (ref 4.0–10.5)

## 2013-11-17 LAB — HEMOGLOBIN A1C: Hgb A1c MFr Bld: 5.4 % (ref 4.6–6.5)

## 2013-11-17 MED ORDER — HYDROCHLOROTHIAZIDE 12.5 MG PO CAPS: 12.5000 mg | ORAL_CAPSULE | Freq: Every day | ORAL | Status: DC

## 2013-11-17 NOTE — Assessment & Plan Note (Signed)
Asympt,  Lab Results  Component Value Date   HGBA1C 5.9* 06/11/2013

## 2013-11-17 NOTE — Patient Instructions (Addendum)
You had the new Prevnar pneumonia shot today  Please take all new medication as prescribed - the HCT fluid pill  Please continue all other medications as before, and refills have been done if requested.  Please have the pharmacy call with any other refills you may need.  Please continue your efforts at being more active, low cholesterol diet, and weight control.  You are otherwise up to date with prevention measures today.  Please keep your appointments with your specialists as you may have planned  Please go to the LAB in the Basement (turn left off the elevator) for the tests to be done today  You will be contacted by phone if any changes need to be made immediately.  Otherwise, you will receive a letter about your results with an explanation, but please check with MyChart first.  Please remember to sign up for MyChart if you have not done so, as this will be important to you in the future with finding out test results, communicating by private email, and scheduling acute appointments online when needed.  Please return in 6 months, or sooner if needed

## 2013-11-17 NOTE — Assessment & Plan Note (Signed)

## 2013-11-17 NOTE — Addendum Note (Signed)
Addended by: Scharlene GlossEWING, Aleta Manternach B on: 11/17/2013 03:32 PM   Modules accepted: Orders

## 2013-11-17 NOTE — Progress Notes (Signed)
Subjective:    Patient ID: Sheryl Copeland, female    DOB: March 29, 1946, 68 y.o.   MRN: [id number]  HPI  Here for wellness and f/u;  Overall doing ok;  Pt denies CP, worsening SOB, DOE, wheezing, orthopnea, PND, worsening LE edema, palpitations, dizziness or syncope.  Pt denies neurological change such as new headache, facial or extremity weakness.  Pt denies polydipsia, polyuria, or low sugar symptoms. Pt states overall good compliance with treatment and medications, good tolerability, and has been trying to follow lower cholesterol diet.  Pt denies worsening depressive symptoms, suicidal ideation or panic. No fever, night sweats, wt loss, loss of appetite, or other constitutional symptoms.  Pt states good ability with ADL's, has low fall risk, home safety reviewed and adequate, no other significant changes in hearing or vision, and only occasionally active with exercise.  Still with numbness to feet which frstruates her eto no end. Denies worsening depressive symptoms, suicidal ideation, or panic. Stopped her lasix and colchicine without adverse events.  Asks to go back to hct for persistent midl le edema left > right, venous insuff. Past Medical History  Diagnosis Date  . Acute meniscal tear of knee sept 2012    right knee  . Arthritis of foot, right     first mtp  . Blood in stool   . Migraine   . Cervical disc disease     s/p cervical spine surgury 1990  . Lumbar disc disease     s/p lumbar fusion 1999  . Allergy   . Asthma   . Hypertension   . Hypothyroidism   . IBS (irritable bowel syndrome) 07/03/2011  . Peripheral neuropathy 07/03/2011  . Impaired glucose tolerance 07/03/2011  . 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 Insomnia 07/15/2011  . Chronic low back pain 07/15/2011  . Hyperlipidemia 07/15/2011  . Restrictive lung disease 07/15/2011   Past Surgical History  Procedure Laterality Date  . Lumbar fusion    . Cholecystectomy  1967  . Appendectomy  1962  .  Tonsillectomy  1967  . Abdominal hysterectomy  1974  . Carpal tunnel release      x 2  . Cervical laminectomy    . Hemorrhoid surgery      reports that she has never smoked. She has never used smokeless tobacco. She reports that she does not drink alcohol or use illicit drugs. family history includes Alcohol abuse in her other and other; Arthritis in her other and other; Cancer in her other and other; Diabetes in her other; Heart disease in her other; Hyperlipidemia in her other; Hypertension in her other and other; Mental illness in her other; Stroke in her other. There is no history of Colon cancer. Allergies  Allergen Reactions  . Aspirin Swelling    Throat closes  . Celebrex [Celecoxib] Hives  . Dipyridamole     Per pt: "opposite effect"  . Eggs Or Egg-Derived Products     diarrhea  . Erythromycin Hives  . Gabapentin     Per pt: uknown  . Iodine Swelling    Throat closes  . Penicillins Diarrhea   Current Outpatient Prescriptions on File Prior to Visit  Medication Sig Dispense Refill  . atenolol (TENORMIN) 50 MG tablet Take 1 tablet (50 mg total) by mouth daily.  30 tablet  1  . BIOTIN PO Take 2 tablets by mouth daily.       . Cholecalciferol (VITAMIN D3) 1000 UNITS CAPS Take 2,000 Units by mouth daily.       .Marland Kitchen  nabumetone (RELAFEN) 500 MG tablet Take 1 tablet (500 mg total) by mouth daily.  90 tablet  3  . thyroid (ARMOUR) 120 MG tablet Take 1 tablet (120 mg total) by mouth daily before breakfast.  90 tablet  3  . traMADol (ULTRAM) 50 MG tablet Take 1 tablet (50 mg total) by mouth every 8 (eight) hours as needed.  270 tablet  1   No current facility-administered medications on file prior to visit.   Review of Systems Constitutional: Negative for increased diaphoresis, other activity, appetite or other siginficant weight change  HENT: Negative for worsening hearing loss, ear pain, facial swelling, mouth sores and neck stiffness.   Eyes: Negative for other worsening pain,  redness or visual disturbance.  Respiratory: Negative for shortness of breath and wheezing.   Cardiovascular: Negative for chest pain and palpitations.  Gastrointestinal: Negative for diarrhea, blood in stool, abdominal distention or other pain Genitourinary: Negative for hematuria, flank pain or change in urine volume.  Musculoskeletal: Negative for myalgias or other joint complaints.  Skin: Negative for color change and wound.  Neurological: Negative for syncope and numbness. other than noted Hematological: Negative for adenopathy. or other swelling Psychiatric/Behavioral: Negative for hallucinations, self-injury, decreased concentration or other worsening agitation.      Objective:   Physical Exam BP 112/70  Pulse 71  Temp(Src) 98.4 F (36.9 C) (Oral)  Ht 5' 3.5" (1.613 m)  Wt 217 lb 8 oz (98.657 kg)  BMI 37.92 kg/m2  SpO2 95% VS noted,  Constitutional: Pt is oriented to person, place, and time. Appears well-developed and well-nourished.  Head: Normocephalic and atraumatic.  Right Ear: External ear normal.  Left Ear: External ear normal.  Nose: Nose normal.  Mouth/Throat: Oropharynx is clear and moist.  Eyes: Conjunctivae and EOM are normal. Pupils are equal, round, and reactive to light.  Neck: Normal range of motion. Neck supple. No JVD present. No tracheal deviation present.  Cardiovascular: Normal rate, regular rhythm, normal heart sounds and intact distal pulses.   Pulmonary/Chest: Effort normal and breath sounds without rales or wheezing  Abdominal: Soft. Bowel sounds are normal. NT. No HSM  Musculoskeletal: Normal range of motion. Exhibits tr to 1+ edema LLE > rle Lymphadenopathy:  Has no cervical adenopathy.  Neurological: Pt is alert and oriented to person, place, and time. Pt has normal reflexes. No cranial nerve deficit. Motor grossly intact Skin: Skin is warm and dry. No rash noted.  Psychiatric:  Has normal mood and affect. Behavior is normal.     Assessment &  Plan:

## 2013-11-17 NOTE — Progress Notes (Signed)
Pre visit review using our clinic review tool, if applicable. No additional management support is needed unless otherwise documented below in the visit note. 

## 2013-11-18 LAB — LIPID PANEL
Cholesterol: 152 mg/dL (ref 0–200)
HDL: 38.2 mg/dL — AB (ref >39.00)
LDL CALC: 84 mg/dL (ref 0–99)
NonHDL: 113.8
TRIGLYCERIDES: 147 mg/dL (ref 0.0–149.0)
Total CHOL/HDL Ratio: 4
VLDL: 29.4 mg/dL (ref 0.0–40.0)

## 2013-11-18 LAB — HEPATIC FUNCTION PANEL
ALT: 21 U/L (ref 0–35)
AST: 26 U/L (ref 0–37)
Albumin: 4.1 g/dL (ref 3.5–5.2)
Alkaline Phosphatase: 43 U/L (ref 39–117)
BILIRUBIN TOTAL: 0.8 mg/dL (ref 0.2–1.2)
Bilirubin, Direct: 0.1 mg/dL (ref 0.0–0.3)
Total Protein: 7.5 g/dL (ref 6.0–8.3)

## 2013-11-18 LAB — BASIC METABOLIC PANEL
BUN: 22 mg/dL (ref 6–23)
CHLORIDE: 105 meq/L (ref 96–112)
CO2: 28 mEq/L (ref 19–32)
CREATININE: 1 mg/dL (ref 0.4–1.2)
Calcium: 9.5 mg/dL (ref 8.4–10.5)
GFR: 59.81 mL/min — ABNORMAL LOW (ref >60.00)
Glucose, Bld: 106 mg/dL — ABNORMAL HIGH (ref 70–99)
Potassium: 3.8 mEq/L (ref 3.5–5.1)
Sodium: 139 mEq/L (ref 135–145)

## 2013-11-18 LAB — TSH: TSH: 2.7 u[IU]/mL (ref 0.35–4.50)

## 2014-01-08 ENCOUNTER — Other Ambulatory Visit: Payer: Self-pay

## 2014-01-08 DIAGNOSIS — Z1231 Encounter for screening mammogram for malignant neoplasm of breast: Secondary | ICD-10-CM

## 2014-01-12 ENCOUNTER — Ambulatory Visit (INDEPENDENT_AMBULATORY_CARE_PROVIDER_SITE_OTHER): Payer: Commercial Managed Care - HMO | Admitting: Internal Medicine

## 2014-01-12 ENCOUNTER — Encounter: Payer: Self-pay | Admitting: Internal Medicine

## 2014-01-12 VITALS — BP 130/70 | HR 69 | Temp 98.5°F | Ht 63.5 in | Wt 218.2 lb

## 2014-01-12 DIAGNOSIS — J018 Other acute sinusitis: Secondary | ICD-10-CM

## 2014-01-12 DIAGNOSIS — I10 Essential (primary) hypertension: Secondary | ICD-10-CM

## 2014-01-12 DIAGNOSIS — J019 Acute sinusitis, unspecified: Secondary | ICD-10-CM | POA: Insufficient documentation

## 2014-01-12 DIAGNOSIS — M5489 Other dorsalgia: Secondary | ICD-10-CM

## 2014-01-12 DIAGNOSIS — M549 Dorsalgia, unspecified: Secondary | ICD-10-CM | POA: Insufficient documentation

## 2014-01-12 MED ORDER — TIZANIDINE HCL 4 MG PO TABS: 4.0000 mg | ORAL_TABLET | Freq: Four times a day (QID) | ORAL | Status: DC | PRN

## 2014-01-12 MED ORDER — LEVOFLOXACIN 250 MG PO TABS: 250.0000 mg | ORAL_TABLET | Freq: Every day | ORAL | Status: DC

## 2014-01-12 NOTE — Progress Notes (Signed)
Subjective:    Patient ID: Sheryl Copeland, female    DOB: May 30, 1945, 68 y.o.   MRN: [id number]  HPI  Here to f/u with 2 wks  acute onset fever, facial pain, pressure, headache, general weakness and malaise, and greenish d/c, with mild ST and cough, but pt denies chest pain, wheezing, increased sob or doe, orthopnea, PND, increased LE swelling, palpitations, dizziness or syncope.  Also with muscular pains to right lateral neck, upper right thoracic, and left lower thoracic paravertebral areas, despite her nsaid and tramadol  Under quite a bit of stress with demented husband with incontinencne and a broken washing machine./Pt denies chest pain, increased sob or doe, wheezing, orthopnea, PND, increased LE swelling, palpitations, dizziness or syncope.   Pt denies polydipsia, polyuria,  Past Medical History  Diagnosis Date  . Acute meniscal tear of knee sept 2012    right knee  . Arthritis of foot, right     first mtp  . Blood in stool   . Migraine   . Cervical disc disease     s/p cervical spine surgury 1990  . Lumbar disc disease     s/p lumbar fusion 1999  . Allergy   . Asthma   . Hypertension   . Hypothyroidism   . IBS (irritable bowel syndrome) 07/03/2011  . Peripheral neuropathy 07/03/2011  . Impaired glucose tolerance 07/03/2011  . 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 Insomnia 07/15/2011  . Chronic low back pain 07/15/2011  . Hyperlipidemia 07/15/2011  . Restrictive lung disease 07/15/2011   Past Surgical History  Procedure Laterality Date  . Lumbar fusion    . Cholecystectomy  1967  . Appendectomy  1962  . Tonsillectomy  1967  . Abdominal hysterectomy  1974  . Carpal tunnel release      x 2  . Cervical laminectomy    . Hemorrhoid surgery      reports that she has never smoked. She has never used smokeless tobacco. She reports that she does not drink alcohol or use illicit drugs. family history includes Alcohol abuse in her other and other; Arthritis in her  other and other; Cancer in her other and other; Diabetes in her other; Heart disease in her other; Hyperlipidemia in her other; Hypertension in her other and other; Mental illness in her other; Stroke in her other. There is no history of Colon cancer. Allergies  Allergen Reactions  . Aspirin Swelling    Throat closes  . Celebrex [Celecoxib] Hives  . Dipyridamole     Per pt: "opposite effect"  . Eggs Or Egg-Derived Products     diarrhea  . Erythromycin Hives  . Gabapentin     Per pt: uknown  . Iodine Swelling    Throat closes  . Penicillins Diarrhea   Current Outpatient Prescriptions on File Prior to Visit  Medication Sig Dispense Refill  . atenolol (TENORMIN) 50 MG tablet Take 1 tablet (50 mg total) by mouth daily.  30 tablet  1  . BIOTIN PO Take 2 tablets by mouth daily.       . Cholecalciferol (VITAMIN D3) 1000 UNITS CAPS Take 2,000 Units by mouth daily.       . hydrochlorothiazide (MICROZIDE) 12.5 MG capsule Take 1 capsule (12.5 mg total) by mouth daily.  90 capsule  3  . nabumetone (RELAFEN) 500 MG tablet Take 1 tablet (500 mg total) by mouth daily.  90 tablet  3  . thyroid (ARMOUR) 120 MG tablet Take 1 tablet (  120 mg total) by mouth daily before breakfast.  90 tablet  3  . traMADol (ULTRAM) 50 MG tablet Take 1 tablet (50 mg total) by mouth every 8 (eight) hours as needed.  270 tablet  1   No current facility-administered medications on file prior to visit.   Review of Systems  Constitutional: Negative for unusual diaphoresis or other sweats  HENT: Negative for ringing in ear Eyes: Negative for double vision or worsening visual disturbance.  Respiratory: Negative for choking and stridor.   Gastrointestinal: Negative for vomiting or other signifcant bowel change Genitourinary: Negative for hematuria or decreased urine volume.  Musculoskeletal: Negative for other MSK pain or swelling Skin: Negative for color change and worsening wound.  Neurological: Negative for tremors and  numbness other than noted  Psychiatric/Behavioral: Negative for decreased concentration or agitation other than above       Objective:   Physical Exam BP 130/70  Pulse 69  Temp(Src) 98.5 F (36.9 C) (Oral)  Ht 5' 3.5" (1.613 m)  Wt 218 lb 4 oz (98.998 kg)  BMI 38.05 kg/m2  SpO2 96% VS noted, mild ill Constitutional: Pt appears well-developed, well-nourished.  HENT: Head: NCAT.  Right Ear: External ear normal.  Left Ear: External ear normal.   Bilat tm's with mild erythema.  Max sinus areas mild tender.  Pharynx with mild erythema, no exudate Eyes: . Pupils are equal, round, and reactive to light. Conjunctivae and EOM are normal Neck: Normal range of motion. Neck supple.  Cardiovascular: Normal rate and regular rhythm.   Pulmonary/Chest: Effort normal and breath sounds normal.  Spine nontender Has palp musc spasm to right trapezoid, right upper thoracic paraverteberal, and lower thoracic paravertebral wtihout rash, swelling, erythema Neurological: Pt is alert. Not confused , motor grossly intact Skin: Skin is warm. No rash Psychiatric: Pt behavior is normal. No agitation.     Assessment & Plan:

## 2014-01-12 NOTE — Patient Instructions (Signed)
Please take all new medication as prescribed - the antibiotic, and muscle relaxer as needed  Please continue all other medications as before, and refills have been done if requested.  Please have the pharmacy call with any other refills you may need.  Please continue your efforts at being more active, low cholesterol diet, and weight control..  Please keep your appointments with your specialists as you may have planned   

## 2014-01-12 NOTE — Assessment & Plan Note (Signed)
stable overall by history and exam, recent data reviewed with pt, and pt to continue medical treatment as before,  to f/u any worsening symptoms or concerns BP Readings from Last 3 Encounters:  01/12/14 130/70  11/17/13 112/70  10/14/13 112/76

## 2014-01-12 NOTE — Progress Notes (Signed)
Pre visit review using our clinic review tool, if applicable. No additional management support is needed unless otherwise documented below in the visit note. 

## 2014-01-12 NOTE — Assessment & Plan Note (Signed)
With several areas msk spasm - for tizanidine prn,  to f/u any worsening symptoms or concerns

## 2014-01-12 NOTE — Assessment & Plan Note (Signed)
Mild to mod, for antibx course,  to f/u any worsening symptoms or concerns 

## 2014-01-20 ENCOUNTER — Ambulatory Visit
Admission: RE | Admit: 2014-01-20 | Discharge: 2014-01-20 | Disposition: A | Discharge status: Home / Self Care | Payer: Commercial Managed Care - HMO | Source: Ambulatory Visit

## 2014-01-20 DIAGNOSIS — Z1231 Encounter for screening mammogram for malignant neoplasm of breast: Secondary | ICD-10-CM

## 2014-05-20 ENCOUNTER — Encounter: Payer: Self-pay | Admitting: Internal Medicine

## 2014-05-21 ENCOUNTER — Other Ambulatory Visit: Payer: Self-pay

## 2014-05-21 MED ORDER — ATENOLOL 50 MG PO TABS: 50.0000 mg | ORAL_TABLET | Freq: Every day | ORAL | Status: DC

## 2014-06-22 DIAGNOSIS — H52223 Regular astigmatism, bilateral: Secondary | ICD-10-CM | POA: Diagnosis not present

## 2014-06-22 DIAGNOSIS — H43393 Other vitreous opacities, bilateral: Secondary | ICD-10-CM | POA: Diagnosis not present

## 2014-07-05 ENCOUNTER — Encounter: Payer: Self-pay | Admitting: Internal Medicine

## 2014-07-06 ENCOUNTER — Telehealth: Payer: Self-pay

## 2014-07-07 ENCOUNTER — Other Ambulatory Visit: Payer: Self-pay | Admitting: *Deleted

## 2014-07-07 MED ORDER — HYDROCHLOROTHIAZIDE 12.5 MG PO CAPS: 12.5000 mg | ORAL_CAPSULE | Freq: Every day | ORAL | Status: DC

## 2014-07-07 MED ORDER — NABUMETONE 500 MG PO TABS
500.0000 mg | ORAL_TABLET | Freq: Every day | ORAL | Status: DC
Start: 2014-07-07 — End: 2014-12-28

## 2014-07-07 MED ORDER — ATENOLOL 50 MG PO TABS: 50.0000 mg | ORAL_TABLET | Freq: Every day | ORAL | Status: DC

## 2014-07-07 NOTE — Telephone Encounter (Signed)
Pt sent email needing refills sent to mail service [facility] on her medications...Raechel Chute/lmb

## 2014-07-07 NOTE — Telephone Encounter (Signed)
Notified pt with md response. Pt stated she has tried the levothyroxine and med doesn't work for her she will purchase put of pocket...Raechel Chute/lmb

## 2014-07-07 NOTE — Telephone Encounter (Signed)
Received fax pt needed PA on her Armour thyroid 120 mg. Completed PA on cover-my-meds yesterday 07/06/14. Received PA response fax stating medication was denied. "Under section 1860D-2 opf the social security act certain drugs are excluded from medicare coverage. Armour thyroid is one of the drugs that are excluded from medicare coverage by law. Denial letter was also sent to patient...Raechel Chute/lmb

## 2014-07-07 NOTE — Telephone Encounter (Signed)
Ok for lucy to contact pt  I can change to levothyroxin, or pt can pay out of pocket for the original armour thyroid - her choice

## 2014-08-26 ENCOUNTER — Other Ambulatory Visit: Payer: Self-pay | Admitting: *Deleted

## 2014-08-26 MED ORDER — POTASSIUM CHLORIDE ER 10 MEQ PO CPCR: 10.0000 meq | ORAL_CAPSULE | Freq: Every day | ORAL | Status: DC

## 2014-09-10 ENCOUNTER — Other Ambulatory Visit: Payer: Self-pay | Admitting: Internal Medicine

## 2014-12-10 ENCOUNTER — Other Ambulatory Visit: Payer: Self-pay | Admitting: Internal Medicine

## 2014-12-13 ENCOUNTER — Other Ambulatory Visit: Payer: Self-pay | Admitting: Internal Medicine

## 2014-12-13 MED ORDER — THYROID 120 MG PO TABS: ORAL_TABLET | ORAL | Status: DC

## 2014-12-14 ENCOUNTER — Other Ambulatory Visit: Payer: Self-pay | Admitting: Internal Medicine

## 2014-12-23 ENCOUNTER — Telehealth: Payer: Self-pay

## 2014-12-23 NOTE — Telephone Encounter (Signed)
Call back to confirm apt at 2pm.. Prior to Dr. Jonny Ruiz at 2:30 on the 23rd

## 2014-12-28 ENCOUNTER — Encounter: Payer: Self-pay | Admitting: Internal Medicine

## 2014-12-28 ENCOUNTER — Other Ambulatory Visit (INDEPENDENT_AMBULATORY_CARE_PROVIDER_SITE_OTHER): Payer: Medicare Other

## 2014-12-28 ENCOUNTER — Ambulatory Visit (INDEPENDENT_AMBULATORY_CARE_PROVIDER_SITE_OTHER): Payer: Medicare Other | Admitting: Internal Medicine

## 2014-12-28 ENCOUNTER — Ambulatory Visit (INDEPENDENT_AMBULATORY_CARE_PROVIDER_SITE_OTHER)
Admission: RE | Admit: 2014-12-28 | Discharge: 2014-12-28 | Disposition: A | Discharge status: Home / Self Care | Payer: Medicare Other | Source: Ambulatory Visit | Attending: Internal Medicine | Admitting: Internal Medicine

## 2014-12-28 VITALS — BP 140/80 | HR 65 | Temp 98.3°F | Ht 64.0 in | Wt 224.8 lb

## 2014-12-28 DIAGNOSIS — R0600 Dyspnea, unspecified: Secondary | ICD-10-CM | POA: Insufficient documentation

## 2014-12-28 DIAGNOSIS — I10 Essential (primary) hypertension: Secondary | ICD-10-CM

## 2014-12-28 DIAGNOSIS — Z0000 Encounter for general adult medical examination without abnormal findings: Secondary | ICD-10-CM | POA: Diagnosis not present

## 2014-12-28 DIAGNOSIS — E785 Hyperlipidemia, unspecified: Secondary | ICD-10-CM

## 2014-12-28 DIAGNOSIS — E039 Hypothyroidism, unspecified: Secondary | ICD-10-CM

## 2014-12-28 DIAGNOSIS — R7302 Impaired glucose tolerance (oral): Secondary | ICD-10-CM

## 2014-12-28 DIAGNOSIS — R0602 Shortness of breath: Secondary | ICD-10-CM

## 2014-12-28 DIAGNOSIS — S91109A Unspecified open wound of unspecified toe(s) without damage to nail, initial encounter: Secondary | ICD-10-CM

## 2014-12-28 DIAGNOSIS — M8580 Other specified disorders of bone density and structure, unspecified site: Secondary | ICD-10-CM

## 2014-12-28 LAB — URINALYSIS, ROUTINE W REFLEX MICROSCOPIC
Bilirubin Urine: NEGATIVE
Ketones, ur: NEGATIVE
Nitrite: NEGATIVE
Specific Gravity, Urine: 1.02 (ref 1.000–1.030)
Total Protein, Urine: NEGATIVE
Urine Glucose: NEGATIVE
Urobilinogen, UA: 0.2 (ref 0.0–1.0)
pH: 6 (ref 5.0–8.0)

## 2014-12-28 LAB — CBC WITH DIFFERENTIAL/PLATELET
Basophils Absolute: 0 K/uL (ref 0.0–0.1)
Basophils Relative: 0.5 % (ref 0.0–3.0)
Eosinophils Absolute: 0.4 K/uL (ref 0.0–0.7)
Eosinophils Relative: 4.7 % (ref 0.0–5.0)
HCT: 42.3 % (ref 36.0–46.0)
Hemoglobin: 14.6 g/dL (ref 12.0–15.0)
Lymphocytes Relative: 31.4 % (ref 12.0–46.0)
Lymphs Abs: 2.7 K/uL (ref 0.7–4.0)
MCHC: 34.4 g/dL (ref 30.0–36.0)
MCV: 90.8 fl (ref 78.0–100.0)
Monocytes Absolute: 0.9 K/uL (ref 0.1–1.0)
Monocytes Relative: 10.2 % (ref 3.0–12.0)
Neutro Abs: 4.6 K/uL (ref 1.4–7.7)
Neutrophils Relative %: 53.2 % (ref 43.0–77.0)
Platelets: 191 K/uL (ref 150.0–400.0)
RBC: 4.66 Mil/uL (ref 3.87–5.11)
RDW: 12.5 % (ref 11.5–15.5)
WBC: 8.6 K/uL (ref 4.0–10.5)

## 2014-12-28 LAB — HEPATIC FUNCTION PANEL
ALT: 28 U/L (ref 0–35)
AST: 27 U/L (ref 0–37)
Albumin: 4.2 g/dL (ref 3.5–5.2)
Alkaline Phosphatase: 42 U/L (ref 39–117)
Bilirubin, Direct: 0.1 mg/dL (ref 0.0–0.3)
Total Bilirubin: 0.5 mg/dL (ref 0.2–1.2)
Total Protein: 7.7 g/dL (ref 6.0–8.3)

## 2014-12-28 LAB — BASIC METABOLIC PANEL
BUN: 20 mg/dL (ref 6–23)
CO2: 29 mEq/L (ref 19–32)
Calcium: 9.6 mg/dL (ref 8.4–10.5)
Chloride: 103 mEq/L (ref 96–112)
Creatinine, Ser: 0.92 mg/dL (ref 0.40–1.20)
GFR: 64.12 mL/min (ref >60.00)
Glucose, Bld: 104 mg/dL — ABNORMAL HIGH (ref 70–99)
POTASSIUM: 3.9 meq/L (ref 3.5–5.1)
SODIUM: 140 meq/L (ref 135–145)

## 2014-12-28 LAB — LIPID PANEL
Cholesterol: 148 mg/dL (ref 0–200)
HDL: 35.9 mg/dL — ABNORMAL LOW (ref >39.00)
LDL Cholesterol: 82 mg/dL (ref 0–99)
NonHDL: 112.55
Total CHOL/HDL Ratio: 4
Triglycerides: 152 mg/dL — ABNORMAL HIGH (ref 0.0–149.0)
VLDL: 30.4 mg/dL (ref 0.0–40.0)

## 2014-12-28 LAB — HEMOGLOBIN A1C: Hgb A1c MFr Bld: 5.4 % (ref 4.6–6.5)

## 2014-12-28 LAB — TSH: TSH: 1.76 u[IU]/mL (ref 0.35–4.50)

## 2014-12-28 MED ORDER — NABUMETONE 500 MG PO TABS: 500.0000 mg | ORAL_TABLET | Freq: Every day | ORAL | Status: DC

## 2014-12-28 NOTE — Patient Instructions (Addendum)
Please continue all other medications as before, and refills have been done if requested.  Please have the pharmacy call with any other refills you may need.  Please continue your efforts at being more active, low cholesterol diet, and weight control.  You are otherwise up to date with prevention measures today.  You will be contacted regarding the referral for: podiatry - but not Dr Blenda Mounts as he passed away a few months ago of cancer  You will be contacted regarding the referral for: echocardiogram  Please keep your appointments with your specialists as you may have planned  Please go to the XRAY Department in the Basement (go straight as you get off the elevator) for the x-ray testing  Please go to the LAB in the Basement (turn left off the elevator) for the tests to be done today  You will be contacted by phone if any changes need to be made immediately.  Otherwise, you will receive a letter about your results with an explanation, but please check with MyChart first.  Please remember to sign up for MyChart if you have not done so, as this will be important to you in the future with finding out test results, communicating by private email, and scheduling acute appointments online when needed.  Please return in 6 months, or sooner if needed   Ms. Sheryl Copeland , Thank you for taking time to come for your Medicare Wellness Visit. I appreciate your ongoing commitment to your health goals. Please review the following plan we discussed and let me know if I can assist you in the future.   Deferred goals at present but did education on exercise and alzheimers disease  Will fup on dexa scan.   These are the goals we discussed: Goals    None      This is a list of the screening recommended for you and due dates:  Health Maintenance  Topic Date Due  .  Hepatitis C: One time screening is recommended by Center for Disease Control  (CDC) for  adults born from [birth year] through 1966.    January 19, 1946  . DEXA scan (bone density measurement)  11/13/2009  . Flu Shot  01/13/2015*  . Mammogram  01/21/2016  . Tetanus Vaccine  05/07/2020  . Colon Cancer Screening  09/02/2022  . Shingles Vaccine  Completed  . Pneumonia vaccines  Completed  *Topic was postponed. The date shown is not the original due date.     Health Maintenance Adopting a healthy lifestyle and getting preventive care can go a long way to promote health and wellness. Talk with your health care provider about what schedule of regular examinations is right for you. This is a good chance for you to check in with your provider about disease prevention and staying healthy. In between checkups, there are plenty of things you can do on your own. Experts have done a lot of research about which lifestyle changes and preventive measures are most likely to keep you healthy. Ask your health care provider for more information. WEIGHT AND DIET  Eat a healthy diet  Be sure to include plenty of vegetables, fruits, low-fat dairy products, and lean protein.  Do not eat a lot of foods high in solid fats, added sugars, or salt.  Get regular exercise. This is one of the most important things you can do for your health.  Most adults should exercise for at least 150 minutes each week. The exercise should increase your heart rate and make you sweat (  moderate-intensity exercise).  Most adults should also do strengthening exercises at least twice a week. This is in addition to the moderate-intensity exercise.  Maintain a healthy weight  Body mass index (BMI) is a measurement that can be used to identify possible weight problems. It estimates body fat based on height and weight. Your health care provider can help determine your BMI and help you achieve or maintain a healthy weight.  For females 46 years of age and older:   A BMI below 18.5 is considered underweight.  A BMI of 18.5 to 24.9 is normal.  A BMI of 25 to 29.9 is  considered overweight.  A BMI of 30 and above is considered obese.  Watch levels of cholesterol and blood lipids  You should start having your blood tested for lipids and cholesterol at 70 years of age, then have this test every 5 years.  You may need to have your cholesterol levels checked more often if:  Your lipid or cholesterol levels are high.  You are older than 70 years of age.  You are at high risk for heart disease.  CANCER SCREENING   Lung Cancer  Lung cancer screening is recommended for adults 41-46 years old who are at high risk for lung cancer because of a history of smoking.  A yearly low-dose CT scan of the lungs is recommended for people who:  Currently smoke.  Have quit within the past 15 years.  Have at least a 30-pack-year history of smoking. A pack year is smoking an average of one pack of cigarettes a day for 1 year.  Yearly screening should continue until it has been 15 years since you quit.  Yearly screening should stop if you develop a health problem that would prevent you from having lung cancer treatment.  Breast Cancer  Practice breast self-awareness. This means understanding how your breasts normally appear and feel.  It also means doing regular breast self-exams. Let your health care provider know about any changes, no matter how small.  If you are in your 20s or 30s, you should have a clinical breast exam (CBE) by a health care provider every 1-3 years as part of a regular health exam.  If you are 43 or older, have a CBE every year. Also consider having a breast X-ray (mammogram) every year.  If you have a family history of breast cancer, talk to your health care provider about genetic screening.  If you are at high risk for breast cancer, talk to your health care provider about having an MRI and a mammogram every year.  Breast cancer gene (BRCA) assessment is recommended for women who have family members with BRCA-related cancers.  BRCA-related cancers include:  Breast.  Ovarian.  Tubal.  Peritoneal cancers.  Results of the assessment will determine the need for genetic counseling and BRCA1 and BRCA2 testing. Cervical Cancer Routine pelvic examinations to screen for cervical cancer are no longer recommended for nonpregnant women who are considered low risk for cancer of the pelvic organs (ovaries, uterus, and vagina) and who do not have symptoms. A pelvic examination may be necessary if you have symptoms including those associated with pelvic infections. Ask your health care provider if a screening pelvic exam is right for you.   The Pap test is the screening test for cervical cancer for women who are considered at risk.  If you had a hysterectomy for a problem that was not cancer or a condition that could lead to cancer, then you  no longer need Pap tests.  If you are older than 65 years, and you have had normal Pap tests for the past 10 years, you no longer need to have Pap tests.  If you have had past treatment for cervical cancer or a condition that could lead to cancer, you need Pap tests and screening for cancer for at least 20 years after your treatment.  If you no longer get a Pap test, assess your risk factors if they change (such as having a new sexual partner). This can affect whether you should start being screened again.  Some women have medical problems that increase their chance of getting cervical cancer. If this is the case for you, your health care provider may recommend more frequent screening and Pap tests.  The human papillomavirus (HPV) test is another test that may be used for cervical cancer screening. The HPV test looks for the virus that can cause cell changes in the cervix. The cells collected during the Pap test can be tested for HPV.  The HPV test can be used to screen women 58 years of age and older. Getting tested for HPV can extend the interval between normal Pap tests from three to  five years.  An HPV test also should be used to screen women of any age who have unclear Pap test results.  After 70 years of age, women should have HPV testing as often as Pap tests.  Colorectal Cancer  This type of cancer can be detected and often prevented.  Routine colorectal cancer screening usually begins at 70 years of age and continues through 70 years of age.  Your health care provider may recommend screening at an earlier age if you have risk factors for colon cancer.  Your health care provider may also recommend using home test kits to check for hidden blood in the stool.  A small camera at the end of a tube can be used to examine your colon directly (sigmoidoscopy or colonoscopy). This is done to check for the earliest forms of colorectal cancer.  Routine screening usually begins at age 60.  Direct examination of the colon should be repeated every 5-10 years through 70 years of age. However, you may need to be screened more often if early forms of precancerous polyps or small growths are found. Skin Cancer  Check your skin from head to toe regularly.  Tell your health care provider about any new moles or changes in moles, especially if there is a change in a mole's shape or color.  Also tell your health care provider if you have a mole that is larger than the size of a pencil eraser.  Always use sunscreen. Apply sunscreen liberally and repeatedly throughout the day.  Protect yourself by wearing long sleeves, pants, a wide-brimmed hat, and sunglasses whenever you are outside. HEART DISEASE, DIABETES, AND HIGH BLOOD PRESSURE   Have your blood pressure checked at least every 1-2 years. High blood pressure causes heart disease and increases the risk of stroke.  If you are between 49 years and 72 years old, ask your health care provider if you should take aspirin to prevent strokes.  Have regular diabetes screenings. This involves taking a blood sample to check your  fasting blood sugar level.  If you are at a normal weight and have a low risk for diabetes, have this test once every three years after 70 years of age.  If you are overweight and have a high risk for diabetes, consider  being tested at a younger age or more often. PREVENTING INFECTION  Hepatitis B  If you have a higher risk for hepatitis B, you should be screened for this virus. You are considered at high risk for hepatitis B if:  You were born in a country where hepatitis B is common. Ask your health care provider which countries are considered high risk.  Your parents were born in a high-risk country, and you have not been immunized against hepatitis B (hepatitis B vaccine).  You have HIV or AIDS.  You use needles to inject street drugs.  You live with someone who has hepatitis B.  You have had sex with someone who has hepatitis B.  You get hemodialysis treatment.  You take certain medicines for conditions, including cancer, organ transplantation, and autoimmune conditions. Hepatitis C  Blood testing is recommended for:  Everyone born from [birth year] through 1966.  Anyone with known risk factors for hepatitis C. Sexually transmitted infections (STIs)  You should be screened for sexually transmitted infections (STIs) including gonorrhea and chlamydia if:  You are sexually active and are younger than 70 years of age.  You are older than 70 years of age and your health care provider tells you that you are at risk for this type of infection.  Your sexual activity has changed since you were last screened and you are at an increased risk for chlamydia or gonorrhea. Ask your health care provider if you are at risk.  If you do not have HIV, but are at risk, it may be recommended that you take a prescription medicine daily to prevent HIV infection. This is called pre-exposure prophylaxis (PrEP). You are considered at risk if:  You are sexually active and do not regularly use condoms or  know the HIV status of your partner(s).  You take drugs by injection.  You are sexually active with a partner who has HIV. Talk with your health care provider about whether you are at high risk of being infected with HIV. If you choose to begin PrEP, you should first be tested for HIV. You should then be tested every 3 months for as long as you are taking PrEP.  PREGNANCY   If you are premenopausal and you may become pregnant, ask your health care provider about preconception counseling.  If you may become pregnant, take 400 to 800 micrograms (mcg) of folic acid every day.  If you want to prevent pregnancy, talk to your health care provider about birth control (contraception). OSTEOPOROSIS AND MENOPAUSE   Osteoporosis is a disease in which the bones lose minerals and strength with aging. This can result in serious bone fractures. Your risk for osteoporosis can be identified using a bone density scan.  If you are 43 years of age or older, or if you are at risk for osteoporosis and fractures, ask your health care provider if you should be screened.  Ask your health care provider whether you should take a calcium or vitamin D supplement to lower your risk for osteoporosis.  Menopause may have certain physical symptoms and risks.  Hormone replacement therapy may reduce some of these symptoms and risks. Talk to your health care provider about whether hormone replacement therapy is right for you.  HOME CARE INSTRUCTIONS   Schedule regular health, dental, and eye exams.  Stay current with your immunizations.   Do not use any tobacco products including cigarettes, chewing tobacco, or electronic cigarettes.  If you are pregnant, do not drink alcohol.  If  you are breastfeeding, limit how much and how often you drink alcohol.  Limit alcohol intake to no more than 1 drink per day for nonpregnant women. One drink equals 12 ounces of beer, 5 ounces of wine, or 1 ounces of hard liquor.  Do  not use street drugs.  Do not share needles.  Ask your health care provider for help if you need support or information about quitting drugs.  Tell your health care provider if you often feel depressed.  Tell your health care provider if you have ever been abused or do not feel safe at home. Document Released: 11/06/2010 Document Revised: 09/07/2013 Document Reviewed: 03/25/2013 [facility] Patient Information 2015 [place], [place]. This information is not intended to replace advice given to you by your health care provider. Make sure you discuss any questions you have with your health care provider.

## 2014-12-28 NOTE — Progress Notes (Signed)
Subjective:    Patient ID: Sheryl Copeland, female    DOB: [birth year], 69 y.o.   MRN: [id number]  HPI  Here for wellness and f/u;  Overall doing ok;  Pt denies Chest pain, wheezing, orthopnea, PND, worsening LE edema, palpitations, dizziness or syncope, except for intermittent sob/doe.  Has inhaler at home but never tried it.No cough, fever, ST. Has hx of asthma, restrictive lung dz and obesity  Pt denies neurological change such as new headache, facial or extremity weakness.  Pt denies polydipsia, polyuria, or low sugar symptoms. Pt states overall good compliance with treatment and medications, good tolerability, and has been trying to follow appropriate diet.  Pt denies worsening depressive symptoms, suicidal ideation or panic. No fever, night sweats, wt loss, loss of appetite, or other constitutional symptoms.  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 Acute meniscal tear of knee sept 2012    right knee  . Arthritis of foot, right     first mtp  . Blood in stool   . Migraine   . Cervical disc disease     s/p cervical spine surgury 1990  . Lumbar disc disease     s/p lumbar fusion 1999  . Allergy   . Asthma   . Hypertension   . Hypothyroidism   . IBS (irritable bowel syndrome) 07/03/2011  . Peripheral neuropathy 07/03/2011  . Impaired glucose tolerance 07/03/2011  . 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 Insomnia 07/15/2011  . Chronic low back pain 07/15/2011  . Hyperlipidemia 07/15/2011  . Restrictive lung disease 07/15/2011   Past Surgical History  Procedure Laterality Date  . Lumbar fusion    . Cholecystectomy  1967  . Appendectomy  1962  . Tonsillectomy  1967  . Abdominal  hysterectomy  1974  . Carpal tunnel release      x 2  . Cervical laminectomy    . Hemorrhoid surgery      reports that she has never smoked. She has never used smokeless tobacco. She reports that she does not drink alcohol or use illicit drugs. family history includes Alcohol abuse in her other and other; Arthritis in her other and other; Cancer in her other and other; Diabetes in her other; Heart disease in her other; Hyperlipidemia in her other; Hypertension in her other and other; Mental illness in her other; Stroke in her other. There is no history of Colon cancer. Allergies  Allergen Reactions  . Aspirin Swelling    Throat closes  . Celebrex [Celecoxib] Hives  . Dipyridamole     Per pt: "opposite effect"  . Eggs Or Egg-Derived Products     diarrhea  . Erythromycin Hives  . Gabapentin     Per pt: uknown  . Iodine Swelling    Throat closes  . Penicillins Diarrhea   Current Outpatient Prescriptions on File Prior to Visit  Medication Sig Dispense Refill  . ARMOUR THYROID 120 MG tablet TAKE 1 TABLET (120 MG TOTAL) BY MOUTH DAILY BEFORE BREAKFAST. 90 tablet 0  . thyroid (ARMOUR THYROID) 120 MG tablet TAKE 1 TABLET (120 MG TOTAL) BY MOUTH DAILY BEFORE BREAKFAST.--patient needs office visit before any further refills 45 tablet 0  . atenolol (TENORMIN) 50 MG tablet  Take 1 tablet (50 mg total) by mouth daily. 90 tablet 1  . BIOTIN PO Take 2 tablets by mouth daily.     . Cholecalciferol (VITAMIN D3) 1000 UNITS CAPS Take 2,000 Units by mouth daily.     . hydrochlorothiazide (MICROZIDE) 12.5 MG capsule Take 1 capsule (12.5 mg total) by mouth daily. 90 capsule 1  . potassium chloride (MICRO-K) 10 MEQ CR capsule Take 1 capsule (10 mEq total) by mouth daily. 90 capsule 2  . tiZANidine (ZANAFLEX) 4 MG tablet Take 1 tablet (4 mg total) by mouth every 6 (six) hours as needed for muscle spasms. (Patient not taking: Reported on 12/28/2014) 40 tablet 0  . traMADol (ULTRAM) 50 MG tablet Take 1 tablet  (50 mg total) by mouth every 8 (eight) hours as needed. 270 tablet 1   No current facility-administered medications on file prior to visit.   Review of Systems Constitutional: Negative for increased diaphoresis, other activity, appetite or siginficant weight change other than noted HENT: Negative for worsening hearing loss, ear pain, facial swelling, mouth sores and neck stiffness.   Eyes: Negative for other worsening pain, redness or visual disturbance.  Respiratory: Negative for shortness of breath and wheezing  Cardiovascular: Negative for chest pain and palpitations.  Gastrointestinal: Negative for diarrhea, blood in stool, abdominal distention or other pain Genitourinary: Negative for hematuria, flank pain or change in urine volume.  Musculoskeletal: Negative for myalgias or other joint complaints.  Skin: Negative for color change and wound or drainage.  Neurological: Negative for syncope and numbness. other than noted Hematological: Negative for adenopathy. or other swelling Psychiatric/Behavioral: Negative for hallucinations, SI, self-injury, decreased concentration or other worsening agitation.      Objective:   Physical Exam BP 140/80 mmHg  Pulse 65  Temp(Src) 98.3 F (36.8 C) (Oral)  Ht 5\' 4"  (1.626 m)  Wt 224 lb 12 oz (101.946 kg)  BMI 38.56 kg/m2  SpO2 96% VS noted, obese Constitutional: Pt is oriented to person, place, and time. Appears well-developed and well-nourished, in no significant distress Head: Normocephalic and atraumatic.  Right Ear: External ear normal.  Left Ear: External ear normal.  Nose: Nose normal.  Mouth/Throat: Oropharynx is clear and moist.  Eyes: Conjunctivae and EOM are normal. Pupils are equal, round, and reactive to light.  Neck: Normal range of motion. Neck supple. No JVD present. No tracheal deviation present or significant neck LA or mass Cardiovascular: Normal rate, regular rhythm, normal heart sounds and intact distal pulses.     Pulmonary/Chest: Effort normal and breath sounds without rales or wheezing  Abdominal: Soft. Bowel sounds are normal. NT. No HSM  Musculoskeletal: Normal range of motion. Exhibits trace to 1+ bilat ankle edema.  Lymphadenopathy:  Has no cervical adenopathy.  Neurological: Pt is alert and oriented to person, place, and time. Pt has normal reflexes. No cranial nerve deficit. Motor grossly intact Skin: Skin is warm and dry. No rash noted. Has callous with early ulceration to plantar distal great toe 1.5 cm, no s/s infection Psychiatric:  Has normal mood and affect. Behavior is normal.     Assessment & Plan:

## 2014-12-28 NOTE — Assessment & Plan Note (Signed)
stable overall by history and exam, recent data reviewed with pt, and pt to continue medical treatment as before,  to f/u any worsening symptoms or concerns Lab Results  Component Value Date   HGBA1C 5.4 11/17/2013   For f/u lab

## 2014-12-28 NOTE — Progress Notes (Signed)
Subjective:   Sheryl Copeland is a 69 y.o. female who presents for Medicare Annual (Subsequent) preventive examination.  Review of Systems:  HRA assessment completed during visit Problem list reviewed and are being managed medically;    Problem list review for lifestyle changes in lieu of HTN; restrictive lung disease; peripheral neuropathy;  Htn; 130.70 last bp/ 140/80 today hyperlipidemia; Labs in July 2015; A1c 5.4; HDL 38; Trig 147; chol 152; LDL 84; ratio 4  Insomnia due to spouse sundowning BMI: > 38 in Sept 2015 Diet; Stopped calcium and is taking Vit d Does cook at home; Breakfast; life cereal; almond milk; does not eat lunch but eats early supper. Meats; vegetables; leafy salads; fish; Exercise; inhibited because of peripheral neuropathy, works outside a lot Spouse has dementia; most likely Alzheimers;  She does not drive; He drives with dementia; the patient tells him where to go; Listens to wife with directions; has forgotten where he put lawn mower, weed eater etc.  Recommend to see PCP / states doctor does those "test" but he is passed these test;  Lives in the county in [place];    Family Hx : mother had breast cancer; father had high bp Social history lives with spouse who is Interior and spatial designer; Doesn't know who to use tools; flairs up emotionally; States she feels safe; Sundowners;   Psychosocial support; safe community; firearm safety; smoke alarms; and other information given related to Alz disease and progression as putting away sharp tools or razor blade or other items he can hurt himself with. alzheimers packet of information given;  Discussed risk of him driving and the need to formulate plan  Caregiver to other? YES     Screenings overdue Hepatitis C discussed  Dexa scan/ 2001 last exam /Agreed to do today if possible ___________________________ Immunizations; Pneumonia vaccines completed Shingles June 2012 Colonoscopy; due 08/2022  Dexa  scan; Bone density 2001 will repeat Mammogram 01/20/14; will have one this year in Sept Vision; Vision checked by [facility] in [place]; No glaucoma; one small cataract Hearing: right ear and left 2000 hz Dental: routinely  Gave information on safety to take home;   Current Care Team reviewed and updated     Objective:     Vitals: BP 140/80 mmHg  Pulse 65  Temp(Src) 98.3 F (36.8 C) (Oral)  Ht 5\' 4"  (1.626 m)  Wt 224 lb 12 oz (101.946 kg)  BMI 38.56 kg/m2  SpO2 96%  Tobacco History  Smoking status  . Never Smoker   Smokeless tobacco  . Never Used     Counseling given: Yes   Past Medical History  Diagnosis Date  . Acute meniscal tear of knee sept 2012    right knee  . Arthritis of foot, right     first mtp  . Blood in stool   . Migraine   . Cervical disc disease     s/p cervical spine surgury 1990  . Lumbar disc disease     s/p lumbar fusion 1999  . Allergy   . Asthma   . Hypertension   . Hypothyroidism   . IBS (irritable bowel syndrome) 07/03/2011  . Peripheral neuropathy 07/03/2011  . Impaired glucose tolerance 07/03/2011  . 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 Insomnia 07/15/2011  . Chronic low back pain 07/15/2011  . Hyperlipidemia 07/15/2011  . Restrictive lung disease 07/15/2011   Past Surgical History  Procedure Laterality Date  . Lumbar fusion    . Cholecystectomy  1967  .  Appendectomy  1962  . Tonsillectomy  1967  . Abdominal hysterectomy  1974  . Carpal tunnel release      x 2  . Cervical laminectomy    . Hemorrhoid surgery     Family History  Problem Relation Age of Onset  . Alcohol abuse Other   . Cancer Other     breast cancer  . Stroke Other   . Hypertension Other   . Mental illness Other   . Arthritis Other   . Alcohol abuse Other   . Arthritis Other   . Cancer Other     prostate cancer and breast cancer  . Hyperlipidemia Other   . Heart disease Other   . Hypertension Other   . Diabetes Other   . Colon cancer Neg Hx      History  Sexual Activity  . Sexual Activity: Not on file    Outpatient Encounter Prescriptions as of 12/28/2014  Medication Sig  . ARMOUR THYROID 120 MG tablet TAKE 1 TABLET (120 MG TOTAL) BY MOUTH DAILY BEFORE BREAKFAST.  Marland Kitchen thyroid (ARMOUR THYROID) 120 MG tablet TAKE 1 TABLET (120 MG TOTAL) BY MOUTH DAILY BEFORE BREAKFAST.--patient needs office visit before any further refills  . atenolol (TENORMIN) 50 MG tablet Take 1 tablet (50 mg total) by mouth daily.  Marland Kitchen BIOTIN PO Take 2 tablets by mouth daily.   . Cholecalciferol (VITAMIN D3) 1000 UNITS CAPS Take 2,000 Units by mouth daily.   . hydrochlorothiazide (MICROZIDE) 12.5 MG capsule Take 1 capsule (12.5 mg total) by mouth daily.  . nabumetone (RELAFEN) 500 MG tablet Take 1 tablet (500 mg total) by mouth daily.  . potassium chloride (MICRO-K) 10 MEQ CR capsule Take 1 capsule (10 mEq total) by mouth daily.  Marland Kitchen tiZANidine (ZANAFLEX) 4 MG tablet Take 1 tablet (4 mg total) by mouth every 6 (six) hours as needed for muscle spasms. (Patient not taking: Reported on 12/28/2014)  . traMADol (ULTRAM) 50 MG tablet Take 1 tablet (50 mg total) by mouth every 8 (eight) hours as needed.  . [DISCONTINUED] levofloxacin (LEVAQUIN) 250 MG tablet Take 1 tablet (250 mg total) by mouth daily. (Patient not taking: Reported on 12/28/2014)   No facility-administered encounter medications on file as of 12/28/2014.    Activities of Daily Living In your present state of health, do you have any difficulty performing the following activities: 12/28/2014  Hearing? N  Vision? N  Difficulty concentrating or making decisions? N  Walking or climbing stairs? Y  Dressing or bathing? N  Doing errands, shopping? N  Preparing Food and eating ? N  Using the Toilet? N  In the past six months, have you accidently leaked urine? N  Do you have problems with loss of bowel control? N  Managing your Medications? N  Managing your Finances? N  Housekeeping or managing your  Housekeeping? N    Patient Care Team: Corwin Levins, MD as PCP - General (Internal Medicine)    Assessment:   Objective:   The goal of the wellness visit is to assist the patient how to close the gaps in care and create a preventative care plan for the patient.  Personalized Education was given regarding:   Pt determined a personalized goal; see patient goals;  Assessment included: smoking (secondary) educated as appropriate; including LDCT if as ppropriate/ does not smoke  Bone density scan as appropriate to complete today Calcium and Vit D as appropriate/ Osteoporosis risk reviewed  Taking meds without issues; no barriers identified  Labs were and fup visit noted with MD if labs are due to be re-drawn.  Stress: Recommendations for managing stress if assessed as a factor;   No Risk for hepatitis or high risk social behavior identified via hepatitis screen  Educated on shingles and follow up with insurance company for co-pays or charges applied to Part D benefit. Educated on Vaccines;  Safety issues reviewed;  Cognition assessed by AD8; Score 0 MMSE deferred as the patient stated they had no memory issues; No identified risk were noted; The patient was oriented x 3; appropriate in dress and manner and no objective failures at ADL's or IADL's.   Depression screen negative;   Functional status reviewed; no losses in function x 1 year  Bowel and bladder issues assessed  End of life planning was discussed; aging in home or other; plans to complete HCPOA were discussed if not completed/ has attorney reviewing      Exercise Activities and Dietary recommendations    Goals    None     Fall Risk Fall Risk  12/28/2014 11/17/2013  Falls in the past year? No No   Depression Screen PHQ 2/9 Scores 12/28/2014 11/17/2013  PHQ - 2 Score 0 0     Cognitive Testing MMSE - Mini Mental State Exam 12/28/2014  Not completed: Unable to complete    Immunization History    Administered Date(s) Administered  . Pneumococcal Conjugate-13 11/17/2013  . Pneumococcal Polysaccharide-23 07/03/2012  . Tdap 05/19/1999  . Zoster 08/22/2007   Screening Tests Health Maintenance  Topic Date Due  . Hepatitis C Screening  [birth year]  . DEXA SCAN  11/13/2009  . INFLUENZA VACCINE  01/13/2015 (Originally 12/06/2014)  . MAMMOGRAM  01/21/2016  . TETANUS/TDAP  05/07/2020  . COLONOSCOPY  09/02/2022  . ZOSTAVAX  Completed  . PNA vac Low Risk Adult  Completed      Plan:   Fup with Alz association Agreed to take dexa scan today  During the course of the visit the patient was educated and counseled about the following appropriate screening and preventive services:   Vaccines to include Pneumoccal, Influenza, Hepatitis B, Td, Zostavax, HCV  Electrocardiogram  Cardiovascular Disease  Colorectal cancer screening  Bone density screening  Diabetes screening  Glaucoma screening  Mammography/PAP  Nutrition counseling   Patient Instructions (the written plan) was given to the patient.   Montine Circle, RN  12/28/2014

## 2014-12-28 NOTE — Assessment & Plan Note (Addendum)
Etiology unclear, for cxr, echo, labs as ordered, ok to try inhaler prn

## 2014-12-28 NOTE — Assessment & Plan Note (Addendum)

## 2014-12-28 NOTE — Assessment & Plan Note (Signed)
Left great toe callous with early ulcer - for referral podiatry - has not seen dr Ralene Cork in approx 4 yrs

## 2014-12-29 ENCOUNTER — Other Ambulatory Visit: Payer: Self-pay

## 2014-12-29 DIAGNOSIS — Z1231 Encounter for screening mammogram for malignant neoplasm of breast: Secondary | ICD-10-CM

## 2015-01-11 ENCOUNTER — Other Ambulatory Visit: Payer: Self-pay

## 2015-01-11 ENCOUNTER — Ambulatory Visit (HOSPITAL_COMMUNITY): Payer: Medicare Other | Attending: Cardiology

## 2015-01-11 DIAGNOSIS — E785 Hyperlipidemia, unspecified: Secondary | ICD-10-CM | POA: Diagnosis not present

## 2015-01-11 DIAGNOSIS — I351 Nonrheumatic aortic (valve) insufficiency: Secondary | ICD-10-CM | POA: Diagnosis not present

## 2015-01-11 DIAGNOSIS — E669 Obesity, unspecified: Secondary | ICD-10-CM | POA: Insufficient documentation

## 2015-01-11 DIAGNOSIS — Z6838 Body mass index (BMI) 38.0-38.9, adult: Secondary | ICD-10-CM | POA: Diagnosis not present

## 2015-01-11 DIAGNOSIS — R0600 Dyspnea, unspecified: Secondary | ICD-10-CM | POA: Insufficient documentation

## 2015-01-11 DIAGNOSIS — I10 Essential (primary) hypertension: Secondary | ICD-10-CM | POA: Diagnosis not present

## 2015-01-18 ENCOUNTER — Encounter: Payer: Self-pay | Admitting: Internal Medicine

## 2015-01-19 ENCOUNTER — Other Ambulatory Visit: Payer: Self-pay | Admitting: Internal Medicine

## 2015-01-19 DIAGNOSIS — M8580 Other specified disorders of bone density and structure, unspecified site: Secondary | ICD-10-CM

## 2015-01-19 NOTE — Telephone Encounter (Signed)
Sheryl Copeland to call pt, to offer OV today  See mychart note above

## 2015-01-20 ENCOUNTER — Ambulatory Visit (INDEPENDENT_AMBULATORY_CARE_PROVIDER_SITE_OTHER): Payer: Medicare Other | Admitting: Internal Medicine

## 2015-01-20 ENCOUNTER — Encounter: Payer: Self-pay | Admitting: Internal Medicine

## 2015-01-20 VITALS — BP 118/80 | HR 81 | Temp 97.9°F | Ht 64.0 in | Wt 226.0 lb

## 2015-01-20 DIAGNOSIS — I10 Essential (primary) hypertension: Secondary | ICD-10-CM | POA: Diagnosis not present

## 2015-01-20 DIAGNOSIS — M7989 Other specified soft tissue disorders: Secondary | ICD-10-CM

## 2015-01-20 DIAGNOSIS — M79675 Pain in left toe(s): Secondary | ICD-10-CM | POA: Insufficient documentation

## 2015-01-20 DIAGNOSIS — M79672 Pain in left foot: Secondary | ICD-10-CM | POA: Diagnosis not present

## 2015-01-20 DIAGNOSIS — R7302 Impaired glucose tolerance (oral): Secondary | ICD-10-CM

## 2015-01-20 MED ORDER — AMOXICILLIN-POT CLAVULANATE 875-125 MG PO TABS: 1.0000 | ORAL_TABLET | Freq: Two times a day (BID) | ORAL | Status: DC

## 2015-01-20 MED ORDER — PREDNISONE 10 MG PO TABS: ORAL_TABLET | ORAL | Status: DC

## 2015-01-20 NOTE — Progress Notes (Signed)
Pre visit review using our clinic review tool, if applicable. No additional management support is needed unless otherwise documented below in the visit note. 

## 2015-01-20 NOTE — Assessment & Plan Note (Signed)
Cant r/o gout vs celulitis vs both - for predpac asd, also augmentin asd, f/u podiatry as planned,

## 2015-01-20 NOTE — Assessment & Plan Note (Signed)
stable overall by history and exam, recent data reviewed with pt, and pt to continue medical treatment as before,  to f/u any worsening symptoms or concerns BP Readings from Last 3 Encounters:  01/20/15 118/80  12/28/14 140/80  01/12/14 130/70

## 2015-01-20 NOTE — Progress Notes (Signed)
Subjective:    Patient ID: Sheryl Copeland, female    DOB: Jun 08, 1945, 69 y.o.   MRN: [id number]  HPI  Here to f/u, overall worse in last 2 wks after did some scalpel work on her chronic left great toe callous on the plantar aspect, has f/u with podiatry in 3 wks for similar, but now has distal foot pain and swelling that incidentally had occurred recently prior to insect sting and rash - better with prednisone, now worse again.  She has some neuropathy.  She had some chronic heel swelling that is no change.  No fever, red streaks, drainage.  No trauma Past Medical History  Diagnosis Date  . Acute meniscal tear of knee sept 2012    right knee  . Arthritis of foot, right     first mtp  . Blood in stool   . Migraine   . Cervical disc disease     s/p cervical spine surgury 1990  . Lumbar disc disease     s/p lumbar fusion 1999  . Allergy   . Asthma   . Hypertension   . Hypothyroidism   . IBS (irritable bowel syndrome) 07/03/2011  . Peripheral neuropathy 07/03/2011  . Impaired glucose tolerance 07/03/2011  . 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 Insomnia 07/15/2011  . Chronic low back pain 07/15/2011  . Hyperlipidemia 07/15/2011  . Restrictive lung disease 07/15/2011   Past Surgical History  Procedure Laterality Date  . Lumbar fusion    . Cholecystectomy  1967  . Appendectomy  1962  . Tonsillectomy  1967  . Abdominal hysterectomy  1974  . Carpal tunnel release      x 2  . Cervical laminectomy    . Hemorrhoid surgery      reports that she has never smoked. She has never used smokeless tobacco. She reports that she does not drink alcohol or use illicit drugs. family history includes Alcohol abuse in her other and other; Arthritis in her other and other; Cancer in her other and other; Diabetes in her other; Heart disease in her other; Hyperlipidemia in her other; Hypertension in her other and other; Mental illness in her other; Stroke in her other. There is no history of  Colon cancer. Allergies  Allergen Reactions  . Aspirin Swelling    Throat closes  . Celebrex [Celecoxib] Hives  . Dipyridamole     Per pt: "opposite effect"  . Eggs Or Egg-Derived Products     diarrhea  . Erythromycin Hives  . Gabapentin     Per pt: uknown  . Iodine Swelling    Throat closes  . Penicillins Diarrhea   Current Outpatient Prescriptions on File Prior to Visit  Medication Sig Dispense Refill  . ARMOUR THYROID 120 MG tablet TAKE 1 TABLET (120 MG TOTAL) BY MOUTH DAILY BEFORE BREAKFAST. 90 tablet 0  . atenolol (TENORMIN) 50 MG tablet Take 1 tablet (50 mg total) by mouth daily. 90 tablet 1  . BIOTIN PO Take 2 tablets by mouth daily.     . Cholecalciferol (VITAMIN D3) 1000 UNITS CAPS Take 2,000 Units by mouth daily.     . hydrochlorothiazide (MICROZIDE) 12.5 MG capsule Take 1 capsule (12.5 mg total) by mouth daily. 90 capsule 1  . nabumetone (RELAFEN) 500 MG tablet Take 1 tablet (500 mg total) by mouth daily. 90 tablet 3  . potassium chloride (MICRO-K) 10 MEQ CR capsule Take 1 capsule (10 mEq total) by mouth daily. 90 capsule 2  .  thyroid (ARMOUR THYROID) 120 MG tablet TAKE 1 TABLET (120 MG TOTAL) BY MOUTH DAILY BEFORE BREAKFAST.--patient needs office visit before any further refills 45 tablet 0  . tiZANidine (ZANAFLEX) 4 MG tablet Take 1 tablet (4 mg total) by mouth every 6 (six) hours as needed for muscle spasms. 40 tablet 0  . traMADol (ULTRAM) 50 MG tablet Take 1 tablet (50 mg total) by mouth every 8 (eight) hours as needed. 270 tablet 1   No current facility-administered medications on file prior to visit.   Review of Systems  Constitutional: Negative for unusual diaphoresis or night sweats HENT: Negative for ringing in ear or discharge Eyes: Negative for double vision or worsening visual disturbance.  Respiratory: Negative for choking and stridor.   Gastrointestinal: Negative for vomiting or other signifcant bowel change Genitourinary: Negative for hematuria or  change in urine volume.  Musculoskeletal: Negative for other MSK pain or swelling Skin: Negative for color change and worsening wound.  Neurological: Negative for tremors and numbness other than noted  Psychiatric/Behavioral: Negative for decreased concentration or agitation other than above       Objective:   Physical Exam BP 118/80 mmHg  Pulse 81  Temp(Src) 97.9 F (36.6 C) (Oral)  Ht 5\' 4"  (1.626 m)  Wt 226 lb (102.513 kg)  BMI 38.77 kg/m2  SpO2 98% VS noted,  Constitutional: Pt appears in no significant distress HENT: Head: NCAT.  Right Ear: External ear normal.  Left Ear: External ear normal.  Eyes: . Pupils are equal, round, and reactive to light. Conjunctivae and EOM are normal Neck: Normal range of motion. Neck supple.  Cardiovascular: Normal rate and regular rhythm.   Pulmonary/Chest: Effort normal and breath sounds without rales or wheezing.  Neurological: Pt is alert. Not confused , motor grossly intact Skin: Skin is warm. No rash, no LE edema exccept for diffuse tender sweling/erythema to distal left foot involving primarly the MTP areas; callous noted, not infected - no drainage though has an open central ulceration; dorsalis pedis trace to 1+ Psychiatric: Pt behavior is normal. No agitation.     Assessment & Plan:

## 2015-01-20 NOTE — Patient Instructions (Signed)
Please take all new medication as prescrib - the antibiotic and prednisone  Please continue all other medications as before, and refills have been done if requested.  Please have the pharmacy call with any other refills you may need.  Please keep your appointments with your specialists as you may have planned

## 2015-01-20 NOTE — Assessment & Plan Note (Signed)
stable overall by history and exam, recent data reviewed with pt, and pt to continue medical treatment as before,  to f/u any worsening symptoms or concerns Lab Results  Component Value Date   HGBA1C 5.4 12/28/2014    

## 2015-01-21 ENCOUNTER — Encounter: Payer: Self-pay | Admitting: Internal Medicine

## 2015-01-21 ENCOUNTER — Other Ambulatory Visit: Payer: Self-pay

## 2015-01-21 MED ORDER — AMOXICILLIN-POT CLAVULANATE 875-125 MG PO TABS: 1.0000 | ORAL_TABLET | Freq: Two times a day (BID) | ORAL | Status: DC

## 2015-01-21 MED ORDER — PREDNISONE 10 MG PO TABS: ORAL_TABLET | ORAL | Status: DC

## 2015-01-25 ENCOUNTER — Ambulatory Visit: Payer: Medicare Other

## 2015-01-25 ENCOUNTER — Inpatient Hospital Stay: Admission: RE | Admit: 2015-01-25 | Payer: Medicare Other | Source: Ambulatory Visit

## 2015-02-09 ENCOUNTER — Ambulatory Visit (INDEPENDENT_AMBULATORY_CARE_PROVIDER_SITE_OTHER): Payer: Medicare Other | Admitting: Podiatry

## 2015-02-09 ENCOUNTER — Ambulatory Visit (INDEPENDENT_AMBULATORY_CARE_PROVIDER_SITE_OTHER): Payer: Medicare Other

## 2015-02-09 ENCOUNTER — Encounter: Payer: Self-pay | Admitting: Podiatry

## 2015-02-09 VITALS — BP 165/87 | HR 58 | Temp 97.6°F | Resp 12

## 2015-02-09 DIAGNOSIS — L97529 Non-pressure chronic ulcer of other part of left foot with unspecified severity: Secondary | ICD-10-CM | POA: Diagnosis not present

## 2015-02-09 DIAGNOSIS — L89891 Pressure ulcer of other site, stage 1: Secondary | ICD-10-CM

## 2015-02-09 NOTE — Progress Notes (Signed)
   Subjective:    Patient ID: Sheryl Copeland, female    DOB: 12-27-1945, 69 y.o.   MRN: [id number]  HPI   This patient presents today complaining of approximately 2 month history of a skin ulcer on the left hallux. She describes treatment including antibiotics and possible prednisone for this lesion. She relates a history of a similar lesion 3 that have resolved with local wound care provided by Dr. Ralene Cork. At this time the lesion is been rather chronic with slight increase in size and soreness . Patient wears a athletic style shoe with a  insole in the shoe She relates a history of decrease feeling, neuropathy associated from back surgery.  Review of Systems  Musculoskeletal: Positive for joint swelling.  All other systems reviewed and are negative.      Objective:   Physical Exam  Orientated 3  Vascular: No peripheral edema noted bilaterally DP pulses 2/4 bilaterally PT pulses/4 bilaterally Capillary reflex immediate bilaterally  Neurological: Sensation to 10 g monofilament wire 0/5 bilaterally Vibratory sensation nonreactive bilaterally Ankle reflexes reactive bilaterally  Dermatological: Superficial ulcer plantar left hallux 7 mm in diameter. The wound does not probe to the bone. The base is granular with a callus surrounding the wound. There is no surrounding erythema, edema, warmth or active drainage noted  Musculoskeletal: Manual motor testing: Dorsi flexion, plantar flexion, inversion, eversion 5/5 bilaterally Functional hallux limitus bilaterally  X-ray examination left foot dated 02/09/2015  intact bony structures without fracture and/or dislocation There is no evidence of cortical disruption with attention to the hallux No emphysema noted No increased soft tissue density noted  Radiographic impression: No x-ray evidence of osteomyelitis left foot No acute bony abnormality left foot    Assessment & Plan:   Assessment: Satisfactory vascular  status 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

## 2015-02-09 NOTE — Patient Instructions (Signed)
On examination today the left great toe has a noninfected skin ulcer. The x-ray examination today demonstrates no bone activity in the ulcer site Using existing Silvadene cream to apply to skin ulcer on the left great toe daily and cover with gauze Wear the surgical shoe on your left foot at all times when standing and walking

## 2015-02-23 ENCOUNTER — Encounter: Payer: Self-pay | Admitting: Podiatry

## 2015-02-23 ENCOUNTER — Ambulatory Visit (INDEPENDENT_AMBULATORY_CARE_PROVIDER_SITE_OTHER): Payer: Medicare Other | Admitting: Podiatry

## 2015-02-23 VITALS — BP 148/80 | HR 87 | Temp 96.6°F | Resp 12

## 2015-02-23 DIAGNOSIS — L97529 Non-pressure chronic ulcer of other part of left foot with unspecified severity: Secondary | ICD-10-CM

## 2015-02-23 DIAGNOSIS — L89891 Pressure ulcer of other site, stage 1: Secondary | ICD-10-CM

## 2015-02-23 NOTE — Patient Instructions (Signed)
Attach the adhesive felt around the skin ulcer on the left great toe daily, apply Silvadene cream, small amount of gauze and secure with 1 inch Coflex tape Continue wearing surgical shoe on left foot

## 2015-02-24 NOTE — Progress Notes (Signed)
Patient ID: Sheryl PollenSheryl D Sowers-Hilmer, female   DOB: 12-11-1945, 69 y.o.   MRN: [id number]  Subjective: This patient presents today for follow-up care for neuropathic skin ulcer on the plantar aspect left foot with treatment beginning in our office on 02/09/2015. She had previous episodes of a skin ulcer in this area at resolve with local wound care. Patient currently wearing surgical shoe on left foot and applying Silvadene cream  Objective: Pleasant orientated 3 Vascular: DP and PT pulses 2/4 bilaterally  Neurological: Sensation to 10 g monofilament wire intact 0/5 bilaterally  Dermatological: The plantar left hallux as a 5 mm superficial ulcer with a granular base surrounded by hyperkeratotic tissue. There is no surrounding erythema, edema, drainage or warmth  Assessment: Neuropathic skin ulcer left hallux Functional hallux limitus contributes to this lesion  Plan: Debride skin ulcer plantar left. Attach cut out felt pad around the area and gave patient additional felt pads to apply for shoe type felt pad to further offload this area after applying Silvadene cream and gauze dressing Attach half-inch Plastizote to patient's surgical shoe on the left foot  Patient will continue to apply Silvadene cream with felt pad daily around wound on left hallux She will continue to wear the [place] shoe with Plastizote insole  Reappoint 2 weeks

## 2015-02-25 ENCOUNTER — Telehealth: Payer: Self-pay | Admitting: *Deleted

## 2015-02-25 MED ORDER — HYDROCHLOROTHIAZIDE 12.5 MG PO CAPS: 12.5000 mg | ORAL_CAPSULE | Freq: Every day | ORAL | Status: DC

## 2015-02-25 NOTE — Telephone Encounter (Signed)
Left msg on triage stating she is needing HCTZ sent to local pharmacy she have not receive mail order. Called pt back no answer LMOM sent to [facility].../lmb

## 2015-02-28 ENCOUNTER — Ambulatory Visit
Admission: RE | Admit: 2015-02-28 | Discharge: 2015-02-28 | Disposition: A | Discharge status: Home / Self Care | Payer: Medicare Other | Source: Ambulatory Visit | Attending: Internal Medicine | Admitting: Internal Medicine

## 2015-02-28 ENCOUNTER — Ambulatory Visit
Admission: RE | Admit: 2015-02-28 | Discharge: 2015-02-28 | Disposition: A | Discharge status: Home / Self Care | Payer: Medicare Other | Source: Ambulatory Visit

## 2015-02-28 DIAGNOSIS — M8580 Other specified disorders of bone density and structure, unspecified site: Secondary | ICD-10-CM

## 2015-02-28 DIAGNOSIS — Z1231 Encounter for screening mammogram for malignant neoplasm of breast: Secondary | ICD-10-CM

## 2015-03-09 ENCOUNTER — Ambulatory Visit: Payer: Medicare Other | Admitting: Podiatry

## 2015-03-10 ENCOUNTER — Emergency Department (HOSPITAL_COMMUNITY): Payer: Medicare Other

## 2015-03-10 ENCOUNTER — Emergency Department (HOSPITAL_COMMUNITY)
Admission: EM | Admit: 2015-03-10 | Discharge: 2015-03-10 | Disposition: A | Discharge status: Home / Self Care | Payer: Medicare Other | Attending: Emergency Medicine | Admitting: Emergency Medicine

## 2015-03-10 ENCOUNTER — Encounter (HOSPITAL_COMMUNITY): Payer: Self-pay | Admitting: *Deleted

## 2015-03-10 ENCOUNTER — Emergency Department (EMERGENCY_DEPARTMENT_HOSPITAL): Payer: Medicare Other

## 2015-03-10 DIAGNOSIS — Z880 Allergy status to penicillin: Secondary | ICD-10-CM | POA: Diagnosis not present

## 2015-03-10 DIAGNOSIS — Z8719 Personal history of other diseases of the digestive system: Secondary | ICD-10-CM | POA: Insufficient documentation

## 2015-03-10 DIAGNOSIS — G43909 Migraine, unspecified, not intractable, without status migrainosus: Secondary | ICD-10-CM | POA: Insufficient documentation

## 2015-03-10 DIAGNOSIS — R2241 Localized swelling, mass and lump, right lower limb: Secondary | ICD-10-CM | POA: Insufficient documentation

## 2015-03-10 DIAGNOSIS — M19071 Primary osteoarthritis, right ankle and foot: Secondary | ICD-10-CM | POA: Insufficient documentation

## 2015-03-10 DIAGNOSIS — M545 Low back pain: Secondary | ICD-10-CM | POA: Diagnosis not present

## 2015-03-10 DIAGNOSIS — Y999 Unspecified external cause status: Secondary | ICD-10-CM | POA: Diagnosis not present

## 2015-03-10 DIAGNOSIS — Z79899 Other long term (current) drug therapy: Secondary | ICD-10-CM | POA: Diagnosis not present

## 2015-03-10 DIAGNOSIS — Z791 Long term (current) use of non-steroidal anti-inflammatories (NSAID): Secondary | ICD-10-CM | POA: Insufficient documentation

## 2015-03-10 DIAGNOSIS — X58XXXA Exposure to other specified factors, initial encounter: Secondary | ICD-10-CM | POA: Diagnosis not present

## 2015-03-10 DIAGNOSIS — Z87828 Personal history of other (healed) physical injury and trauma: Secondary | ICD-10-CM | POA: Diagnosis not present

## 2015-03-10 DIAGNOSIS — J45909 Unspecified asthma, uncomplicated: Secondary | ICD-10-CM | POA: Diagnosis not present

## 2015-03-10 DIAGNOSIS — Y929 Unspecified place or not applicable: Secondary | ICD-10-CM | POA: Diagnosis not present

## 2015-03-10 DIAGNOSIS — E039 Hypothyroidism, unspecified: Secondary | ICD-10-CM | POA: Insufficient documentation

## 2015-03-10 DIAGNOSIS — G8929 Other chronic pain: Secondary | ICD-10-CM | POA: Insufficient documentation

## 2015-03-10 DIAGNOSIS — M7989 Other specified soft tissue disorders: Secondary | ICD-10-CM | POA: Diagnosis not present

## 2015-03-10 DIAGNOSIS — Y939 Activity, unspecified: Secondary | ICD-10-CM | POA: Insufficient documentation

## 2015-03-10 DIAGNOSIS — S79912A Unspecified injury of left hip, initial encounter: Secondary | ICD-10-CM | POA: Insufficient documentation

## 2015-03-10 DIAGNOSIS — Z792 Long term (current) use of antibiotics: Secondary | ICD-10-CM | POA: Diagnosis not present

## 2015-03-10 DIAGNOSIS — I10 Essential (primary) hypertension: Secondary | ICD-10-CM | POA: Diagnosis not present

## 2015-03-10 DIAGNOSIS — Z7952 Long term (current) use of systemic steroids: Secondary | ICD-10-CM | POA: Diagnosis not present

## 2015-03-10 DIAGNOSIS — S8992XA Unspecified injury of left lower leg, initial encounter: Secondary | ICD-10-CM | POA: Diagnosis not present

## 2015-03-10 DIAGNOSIS — R2242 Localized swelling, mass and lump, left lower limb: Secondary | ICD-10-CM | POA: Diagnosis present

## 2015-03-10 MED ORDER — TRAMADOL HCL 50 MG PO TABS: 50.0000 mg | ORAL_TABLET | Freq: Three times a day (TID) | ORAL | Status: DC | PRN

## 2015-03-10 NOTE — Discharge Instructions (Signed)

## 2015-03-10 NOTE — ED Notes (Signed)
Patient reports left foot swelling (inflammatory and has been seen for same by PCP).Marland Kitchen.states because of the boot she was wearing with left foot she now has back pain and left leg pain.

## 2015-03-10 NOTE — Progress Notes (Signed)
*  PRELIMINARY RESULTS* Vascular Ultrasound Left lower extremity venous duplex has been completed.  Preliminary findings: No evidence of DVT or baker's cyst.   Farrel DemarkJill Eunice, RDMS, RVT  03/10/2015, 4:28 PM

## 2015-03-10 NOTE — ED Provider Notes (Signed)
CSN: [id number]     Arrival date & time 03/10/15  1231 History  By signing my name below, I, Tanda Rockers, attest that this documentation has been prepared under the direction and in the presence of Arthor Captain, PA-C.  Electronically Signed: Tanda Rockers, ED Scribe. 03/10/2015. 3:57 PM.  Chief Complaint  Patient presents with  . Foot Swelling   The history is provided by the patient. No language interpreter was used.     HPI Comments: Dareen Gutzwiller Sowers-Luka is a 69 y.o. female who presents to the Emergency Department complaining of gradual onset, constant, worsening left leg swelling, left knee pain, and left lower back pain x 3-4 days. Pt states that she had cellulitis in her left leg approximately 1 year ago and that the swelling never went down. Pt also has a recurring ulcer on her left great toe that opens up about once per year. Pt is being evaluated for this ulcer by a podiatrist and was placed in a boot recently. She notes that since wearing the boot, her left leg has only swollen more with swelling to the right knee as well. Pt mentions difficulty walking with the boot, causing her to twist her left knee the other day. She also notes having lower back pain from walking on the boot. Pt has not worn the boot since injuring her knee due to the pain. Denies fever, chills, weakness, numbness, or any other associated symptoms. No hx DVT.   PCP - Dr. Oliver Barre.   Past Medical History  Diagnosis Date  . Acute meniscal tear of knee sept 2012    right knee  . Arthritis of foot, right     first mtp  . Blood in stool   . Migraine   . Cervical disc disease     s/p cervical spine surgury 1990  . Lumbar disc disease     s/p lumbar fusion 1999  . Allergy   . Asthma   . Hypertension   . Hypothyroidism   . IBS (irritable bowel syndrome) 07/03/2011  . Peripheral neuropathy (HCC) 07/03/2011  . Impaired glucose tolerance 07/03/2011  . 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 Insomnia  07/15/2011  . Chronic low back pain 07/15/2011  . Hyperlipidemia 07/15/2011  . Restrictive lung disease 07/15/2011   Past Surgical History  Procedure Laterality Date  . Lumbar fusion    . Cholecystectomy  1967  . Appendectomy  1962  . Tonsillectomy  1967  . Abdominal hysterectomy  1974  . Carpal tunnel release      x 2  . Cervical laminectomy    . Hemorrhoid surgery     Family History  Problem Relation Age of Onset  . Alcohol abuse Other   . Cancer Other     breast cancer  . Stroke Other   . Hypertension Other   . Mental illness Other   . Arthritis Other   . Alcohol abuse Other   . Arthritis Other   . Cancer Other     prostate cancer and breast cancer  . Hyperlipidemia Other   . Heart disease Other   . Hypertension Other   . Diabetes Other   . Colon cancer Neg Hx    Social History  Substance Use Topics  . Smoking status: Never Smoker   . Smokeless tobacco: Never Used  . Alcohol Use: No   OB History    No data available     Review of Systems  Constitutional: Negative for fever  and chills.  Musculoskeletal: Positive for back pain, joint swelling and arthralgias (Left knee pain).  Neurological: Negative for weakness and numbness.   Allergies  Aspirin; Celebrex; Dipyridamole; Eggs or egg-derived products; Erythromycin; Gabapentin; Iodine; and Penicillins  Home Medications   Prior to Admission medications   Medication Sig Start Date End Date Taking? Authorizing Provider  amoxicillin-clavulanate (AUGMENTIN) 875-125 MG per tablet Take 1 tablet by mouth 2 (two) times daily. 01/21/15   Corwin Levins, MD  ARMOUR THYROID 120 MG tablet TAKE 1 TABLET (120 MG TOTAL) BY MOUTH DAILY BEFORE BREAKFAST. 12/14/14   Corwin Levins, MD  atenolol (TENORMIN) 50 MG tablet Take 1 tablet (50 mg total) by mouth daily. 07/07/14   Corwin Levins, MD  BIOTIN PO Take 2 tablets by mouth daily.     Historical Provider, MD  Cholecalciferol (VITAMIN D3) 1000 UNITS CAPS Take 2,000 Units by mouth daily.      Historical Provider, MD  EPIPEN 2-PAK 0.3 MG/0.3ML SOAJ injection  01/01/15   Historical Provider, MD  hydrochlorothiazide (MICROZIDE) 12.5 MG capsule Take 1 capsule (12.5 mg total) by mouth daily. 02/25/15   Corwin Levins, MD  hydrOXYzine (ATARAX/VISTARIL) 25 MG tablet  01/01/15   Historical Provider, MD  nabumetone (RELAFEN) 500 MG tablet Take 1 tablet (500 mg total) by mouth daily. 12/28/14   Corwin Levins, MD  potassium chloride (MICRO-K) 10 MEQ CR capsule Take 1 capsule (10 mEq total) by mouth daily. 08/26/14   Corwin Levins, MD  predniSONE (DELTASONE) 10 MG tablet 3 tabs by mouth per day for 3 days,2tabs per day for 3 days,1tab per day for 3 days 01/21/15   Corwin Levins, MD  thyroid [facility] THYROID) 120 MG tablet TAKE 1 TABLET (120 MG TOTAL) BY MOUTH DAILY BEFORE BREAKFAST.--patient needs office visit before any further refills 12/13/14   Corwin Levins, MD  tiZANidine (ZANAFLEX) 4 MG tablet Take 1 tablet (4 mg total) by mouth every 6 (six) hours as needed for muscle spasms. 01/12/14   Corwin Levins, MD  traMADol (ULTRAM) 50 MG tablet Take 1 tablet (50 mg total) by mouth every 8 (eight) hours as needed. 09/09/13   Corwin Levins, MD   Triage Vitals: BP 170/72 mmHg  Pulse 74  Temp(Src) 98.3 F (36.8 C) (Oral)  Resp 18  SpO2 95%   Physical Exam  Constitutional: She is oriented to person, place, and time. She appears well-developed and well-nourished. No distress.  HENT:  Head: Normocephalic and atraumatic.  Eyes: Conjunctivae and EOM are normal.  Neck: Neck supple. No tracheal deviation present.  Cardiovascular: Normal rate.   Pulmonary/Chest: Effort normal. No respiratory distress.  Musculoskeletal: Normal range of motion. She exhibits tenderness.  Left lower extremity; Corn with central ulceration on the base of her left big toe Pulses intact Pitting edema to mid calf  Left knee; Swelling up to her knee as well Tender on the lateral portions of the left knee bilaterally Ligamentously  stable Negative McMurray's  Fulls strength and ROM No swelling, deformities, or bruising  Gluteal tenderness on the left  Neurological: She is alert and oriented to person, place, and time.  Skin: Skin is warm and dry.  Psychiatric: She has a normal mood and affect. Her behavior is normal.  Nursing note and vitals reviewed.   ED Course  Procedures (including critical care time)  DIAGNOSTIC STUDIES: Oxygen Saturation is 95% on RA, normal by my interpretation.    COORDINATION OF CARE: 3:51 PM-Discussed  treatment plan which includes DG L Knee and US Lower Extremity with pt at bedside and pt agreed to plan.   Labs Review Labs Reviewed - No data to display  Imaging Review No results found. I have personally reviewed and evaluated these images as part of my medical decision-making.   EKG Interpretation None      MDM   Final diagnoses:  Knee injury, left, initial encounter  Left leg swelling    Patient imaging negative for clot or fracture. Suspect sprain and swelling secondary to the injury. D/c with knee sleeve and tramadol; F/u with ortho I personally performed the services described in this documentation, which was scribed in my presence. The recorded information has been reviewed and is accurate.        Arthor Captainbigail Brayten Komar, PA-C 03/10/15 1730  Mirian MoMatthew Gentry, MD 03/11/15 1014

## 2015-03-21 ENCOUNTER — Ambulatory Visit (INDEPENDENT_AMBULATORY_CARE_PROVIDER_SITE_OTHER): Payer: Medicare Other | Admitting: Internal Medicine

## 2015-03-21 ENCOUNTER — Ambulatory Visit: Payer: Medicare Other | Admitting: Internal Medicine

## 2015-03-21 ENCOUNTER — Encounter: Payer: Self-pay | Admitting: Internal Medicine

## 2015-03-21 VITALS — BP 144/78 | HR 72 | Temp 98.2°F | Resp 16 | Wt 226.0 lb

## 2015-03-21 DIAGNOSIS — L97929 Non-pressure chronic ulcer of unspecified part of left lower leg with unspecified severity: Secondary | ICD-10-CM

## 2015-03-21 DIAGNOSIS — L97829 Non-pressure chronic ulcer of other part of left lower leg with unspecified severity: Secondary | ICD-10-CM | POA: Diagnosis not present

## 2015-03-21 DIAGNOSIS — L03032 Cellulitis of left toe: Secondary | ICD-10-CM

## 2015-03-21 DIAGNOSIS — R609 Edema, unspecified: Secondary | ICD-10-CM | POA: Diagnosis not present

## 2015-03-21 MED ORDER — HYDROCHLOROTHIAZIDE 12.5 MG PO CAPS: 12.5000 mg | ORAL_CAPSULE | Freq: Every day | ORAL | Status: DC

## 2015-03-21 MED ORDER — CEPHALEXIN 500 MG PO CAPS: 500.0000 mg | ORAL_CAPSULE | Freq: Three times a day (TID) | ORAL | Status: DC

## 2015-03-21 MED ORDER — HYDROCHLOROTHIAZIDE 12.5 MG PO CAPS: 25.0000 mg | ORAL_CAPSULE | Freq: Every day | ORAL | Status: DC

## 2015-03-21 MED ORDER — THYROID 120 MG PO TABS: 120.0000 mg | ORAL_TABLET | Freq: Every day | ORAL | Status: DC

## 2015-03-21 MED ORDER — CEFTRIAXONE SODIUM 1 G IJ SOLR
1.0000 g | Freq: Once | INTRAMUSCULAR | Status: AC
  Administered 2015-03-21: 1 g via INTRAMUSCULAR

## 2015-03-21 NOTE — Patient Instructions (Addendum)
You received an injection of an antibiotic here today.    Start taking keflex (oral antibiotic) tomorrow.  Increase your hydrochlorothiazide to two pills daily (25 mg ).  Both prescriptions were sent to your pharmacy.    A referral was ordered for vascular surgery.  Follow up if your symptoms are not improving.

## 2015-03-21 NOTE — Progress Notes (Signed)
Subjective:    Patient ID: Sheryl Copeland, female    DOB: 1945/08/04, 69 y.o.   MRN: [id number]  HPI  She is here for an acute visit for left first toe swelling, redness and an open wound.  A year a a half ago she had a norovirus.  This illness resulted in a blister on the bottom of her left first toe. She developed cellulitis, which was successfully treated. Since that time she has had a scalloped blister on the bottom of her left first toe that never went away. She felt like she had a cord in the blister and she was referred to podiatry a couple of months ago because the bottom of her toe opened up. Her left ankle and foot swelled up and have remained swollen. The podiatrist for her foot and ankle, but there is no improvement. He put her in a slightly higher boot, but this caused her to walk funny and ended up resulting in left knee pain and back pain. Last night her foot and leg looked red, more swollen and felt warm. The top of her toe this morning was swollen, red and she had an open wound that was losing bloody pus. She has been experiencing some chills, but denies any fevers. She has peripheral neuropathy in her feet, left foot worse than the right. She has significantly decreased sensation in her left foot. She does monitor her feet on a daily basis.   Left medial ankle pain: Her other foot has no lesions. She states she does get intermittent right medial ankle discomfort that is random. She states the pains in this area appears swollen at times.  Medications and allergies reviewed with patient and updated if appropriate.  Patient Active Problem List   Diagnosis Date Noted  . Pain and swelling of toe of left foot 01/20/2015  . Dyspnea 12/28/2014  . Acute sinus infection 01/12/2014  . Back pain 01/12/2014  . Peripheral edema 10/14/2013  . Gout 06/12/2013  . Right knee pain 06/12/2013  . Cellulitis and abscess of toe of left foot 06/09/2013  . Chronic meniscal tear of  knee 07/15/2011  . Insomnia 07/15/2011  . Chronic low back pain 07/15/2011  . Hyperlipidemia 07/15/2011  . Restrictive lung disease 07/15/2011  . Migraine   . IBS (irritable bowel syndrome) 07/03/2011  . Peripheral neuropathy (HCC) 07/03/2011  . Impaired glucose tolerance 07/03/2011  . Wound, open, toe 07/03/2011  . Cervical disc disease   . Lumbar disc disease   . Allergy   . Asthma, intermittent   . Hypertension   . Hypothyroidism   . Preventative health care 06/30/2011    Current Outpatient Prescriptions on File Prior to Visit  Medication Sig Dispense Refill  . atenolol (TENORMIN) 50 MG tablet Take 1 tablet (50 mg total) by mouth daily. 90 tablet 1  . BIOTIN PO Take 2 tablets by mouth daily.     . Cholecalciferol (VITAMIN D3) 1000 UNITS CAPS Take 2,000 Units by mouth daily.     Marland Kitchen. EPIPEN 2-PAK 0.3 MG/0.3ML SOAJ injection     . nabumetone (RELAFEN) 500 MG tablet Take 1 tablet (500 mg total) by mouth daily. 90 tablet 3  . potassium chloride (MICRO-K) 10 MEQ CR capsule Take 1 capsule (10 mEq total) by mouth daily. 90 capsule 2  . traMADol (ULTRAM) 50 MG tablet Take 1 tablet (50 mg total) by mouth every 8 (eight) hours as needed. 270 tablet 1   No current facility-administered medications on  file prior to visit.    Past Medical History  Diagnosis Date  . Acute meniscal tear of knee sept 2012    right knee  . Arthritis of foot, right     first mtp  . Blood in stool   . Migraine   . Cervical disc disease     s/p cervical spine surgury 1990  . Lumbar disc disease     s/p lumbar fusion 1999  . Allergy   . Asthma   . Hypertension   . Hypothyroidism   . IBS (irritable bowel syndrome) 07/03/2011  . Peripheral neuropathy (HCC) 07/03/2011  . Impaired glucose tolerance 07/03/2011  . 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 Insomnia 07/15/2011  . Chronic low back pain 07/15/2011  . Hyperlipidemia 07/15/2011  . Restrictive lung disease 07/15/2011    Past Surgical History    Procedure Laterality Date  . Lumbar fusion    . Cholecystectomy  1967  . Appendectomy  1962  . Tonsillectomy  1967  . Abdominal hysterectomy  1974  . Carpal tunnel release      x 2  . Cervical laminectomy    . Hemorrhoid surgery      Social History   Social History  . Marital Status: Married    Spouse Name: N/A  . Number of Children: N/A  . Years of Education: N/A   Social History Main Topics  . Smoking status: Never Smoker   . Smokeless tobacco: Never Used  . Alcohol Use: No  . Drug Use: No  . Sexual Activity: Not on file   Other Topics Concern  . Not on file   Social History Narrative   Live with spouse that is dementing       Review of Systems  Constitutional: Positive for chills. Negative for fever.  Cardiovascular: Positive for leg swelling (Acute on chronic).  Skin: Positive for color change (Redness  left foot that extends up leg) and wound (discharge is bloody-pus).  Neurological: Positive for numbness.       Objective:   Filed Vitals:   03/21/15 1555  BP: 144/78  Pulse: 72  Temp: 98.2 F (36.8 C)  Resp: 16   Filed Weights   03/21/15 1555  Weight: 226 lb (102.513 kg)   Body mass index is 38.77 kg/(m^2).   Physical Exam  Constitutional: She appears well-developed and well-nourished. No distress.  Musculoskeletal: She exhibits edema (left leg > right leg).  Skin:  Left first toe - plantar surface with scab/no open wound, dorsal toe and lateral aspect of toe with open wound, minimal discharge - serosanguineous, erythema dorsal aspect of fluids that extends midway up lower leg, edema foot and lower leg, foot and toe slightly warm to touch, minimal tenderness secondary to neuropathy          Assessment & Plan:   Ulcer left first toe with cellulitis, edema (acute on chronic) Ceftriaxone 1 g IM 1 today Start Keflex 500 mg 3 times a day tomorrow Increase hydrochlorothiazide to 25 mg daily to help reduce swelling Refer to vascular surgery  for further evaluation of painful veins in the right foot and nonhealing ulcers She is compliant with checking her feet daily with her peripheral neuropathy If no improvement, any worsening she will call or return

## 2015-03-21 NOTE — Progress Notes (Signed)
Pre visit review using our clinic review tool, if applicable. No additional management support is needed unless otherwise documented below in the visit note. 

## 2015-03-22 ENCOUNTER — Ambulatory Visit: Payer: Medicare Other | Admitting: Internal Medicine

## 2015-04-04 ENCOUNTER — Encounter: Payer: Medicare Other | Admitting: Vascular Surgery

## 2015-04-04 ENCOUNTER — Encounter (HOSPITAL_COMMUNITY): Payer: Medicare Other

## 2015-04-08 ENCOUNTER — Encounter: Payer: Self-pay | Admitting: Vascular Surgery

## 2015-04-09 ENCOUNTER — Other Ambulatory Visit: Payer: Self-pay | Admitting: Internal Medicine

## 2015-04-11 ENCOUNTER — Other Ambulatory Visit: Payer: Self-pay | Admitting: *Deleted

## 2015-04-11 DIAGNOSIS — I83893 Varicose veins of bilateral lower extremities with other complications: Secondary | ICD-10-CM

## 2015-04-12 ENCOUNTER — Ambulatory Visit (HOSPITAL_COMMUNITY)
Admission: RE | Admit: 2015-04-12 | Discharge: 2015-04-12 | Disposition: A | Discharge status: Home / Self Care | Payer: Medicare Other | Source: Ambulatory Visit | Attending: Vascular Surgery | Admitting: Vascular Surgery

## 2015-04-12 ENCOUNTER — Encounter: Payer: Self-pay | Admitting: Vascular Surgery

## 2015-04-12 ENCOUNTER — Ambulatory Visit (INDEPENDENT_AMBULATORY_CARE_PROVIDER_SITE_OTHER): Payer: Medicare Other | Admitting: Vascular Surgery

## 2015-04-12 VITALS — BP 139/79 | HR 66 | Temp 98.2°F | Resp 16 | Ht 63.5 in | Wt 223.0 lb

## 2015-04-12 DIAGNOSIS — R600 Localized edema: Secondary | ICD-10-CM | POA: Diagnosis not present

## 2015-04-12 DIAGNOSIS — I10 Essential (primary) hypertension: Secondary | ICD-10-CM | POA: Diagnosis not present

## 2015-04-12 DIAGNOSIS — I83893 Varicose veins of bilateral lower extremities with other complications: Secondary | ICD-10-CM

## 2015-04-12 DIAGNOSIS — I8391 Asymptomatic varicose veins of right lower extremity: Secondary | ICD-10-CM | POA: Diagnosis not present

## 2015-04-12 DIAGNOSIS — R609 Edema, unspecified: Secondary | ICD-10-CM | POA: Diagnosis present

## 2015-04-12 NOTE — Progress Notes (Signed)
Vascular and Vein Specialist of [facility]  Patient name: Sheryl Copeland MRN: [id number] DOB: 01/05/1946 Sex: female  REASON FOR CONSULT: Pain and swelling in both lower extremities and nonhealing ulceration of left great toe  HPI: Sheryl Copeland is a 69 y.o. female, who is in today for evaluation of lower extremity swelling and pain. She has a very complex history. She apparently had ulceration of her plantar aspect of her left great toe after a viral infection. This is had difficulty healing for over one year. She reports that she has continued to have increased swelling in both lower extremities possibly more on her right than on her left. She reports that this has persistent swelling even with elevation at night. She does not have any history of typical venous ulcerations or changes of chronic venous hypertension. She does have some slightly prominent veins on the dorsum of her foot and reports that she has pain when these are engorged. She does not have any varicosities in her legs and does have a few scattered typical telangiectasia over her legs bilaterally.  Past Medical History  Diagnosis Date  . Acute meniscal tear of knee sept 2012    right knee  . Arthritis of foot, right     first mtp  . Blood in stool   . Migraine   . Cervical disc disease     s/p cervical spine surgury 1990  . Lumbar disc disease     s/p lumbar fusion 1999  . Allergy   . Asthma   . Hypertension   . Hypothyroidism   . IBS (irritable bowel syndrome) 07/03/2011  . Peripheral neuropathy (HCC) 07/03/2011  . Impaired glucose tolerance 07/03/2011  . 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 Insomnia 07/15/2011  . Chronic low back pain 07/15/2011  . Hyperlipidemia 07/15/2011  . Restrictive lung disease 07/15/2011    Family History  Problem Relation Age of Onset  . Alcohol abuse Other   . Cancer Other     breast cancer  . Stroke Other   . Hypertension Other   . Mental illness Other     . Arthritis Other   . Alcohol abuse Other   . Arthritis Other   . Cancer Other     prostate cancer and breast cancer  . Hyperlipidemia Other   . Heart disease Other   . Hypertension Other   . Diabetes Other   . Colon cancer Neg Hx     SOCIAL HISTORY: Social History   Social History  . Marital Status: Married    Spouse Name: N/A  . Number of Children: N/A  . Years of Education: N/A   Occupational History  . Not on file.   Social History Main Topics  . Smoking status: Never Smoker   . Smokeless tobacco: Never Used  . Alcohol Use: No  . Drug Use: No  . Sexual Activity: Not on file   Other Topics Concern  . Not on file   Social History Narrative   Live with spouse that is dementing       Allergies  Allergen Reactions  . Aspirin Swelling    Throat closes  . Celebrex [Celecoxib] Hives  . Dipyridamole     Per pt: "opposite effect"  . Eggs Or Egg-Derived Products     diarrhea  . Erythromycin Hives  . Gabapentin     Per pt: uknown  . Iodine Swelling    Throat closes  . Penicillins Diarrhea  Current Outpatient Prescriptions  Medication Sig Dispense Refill  . atenolol (TENORMIN) 50 MG tablet Take 1 tablet by mouth  daily 90 tablet 2  . BIOTIN PO Take 2 tablets by mouth daily.     . Cholecalciferol (VITAMIN D3) 1000 UNITS CAPS Take 2,000 Units by mouth daily.     Marland Kitchen. EPIPEN 2-PAK 0.3 MG/0.3ML SOAJ injection     . hydrochlorothiazide (MICROZIDE) 12.5 MG capsule Take 2 capsules (25 mg total) by mouth daily. 180 capsule 1  . nabumetone (RELAFEN) 500 MG tablet Take 1 tablet (500 mg total) by mouth daily. 90 tablet 3  . potassium chloride (MICRO-K) 10 MEQ CR capsule Take 1 capsule (10 mEq total) by mouth daily. 90 capsule 2  . thyroid (ARMOUR THYROID) 120 MG tablet Take 1 tablet (120 mg total) by mouth daily before breakfast. 90 tablet 1  . traMADol (ULTRAM) 50 MG tablet Take 1 tablet (50 mg total) by mouth every 8 (eight) hours as needed. 270 tablet 1  .  cephALEXin (KEFLEX) 500 MG capsule Take 1 capsule (500 mg total) by mouth 3 (three) times daily. (Patient not taking: Reported on 04/12/2015) 30 capsule 0   No current facility-administered medications for this visit.    REVIEW OF SYSTEMS:  [X]  denotes positive finding, [ ]  denotes negative finding Cardiac  Comments:  Chest pain or chest pressure:    Shortness of breath upon exertion:    Short of breath when lying flat:    Irregular heart rhythm:        Vascular    Pain in calf, thigh, or hip brought on by ambulation:    Pain in feet at night that wakes you up from your sleep:  x   Blood clot in your veins:    Leg swelling:  x       Pulmonary    Oxygen at home:    Productive cough:     Wheezing:  x       Neurologic    Sudden weakness in arms or legs:     Sudden numbness in arms or legs:     Sudden onset of difficulty speaking or slurred speech:    Temporary loss of vision in one eye:     Problems with dizziness:         Gastrointestinal    Blood in stool:     Vomited blood:         Genitourinary    Burning when urinating:     Blood in urine:        Psychiatric    Major depression:         Hematologic    Bleeding problems:    Problems with blood clotting too easily:        Skin    Rashes or ulcers: x       Constitutional    Fever or chills:      PHYSICAL EXAM: Filed Vitals:   04/12/15 1510  BP: 139/79  Pulse: 66  Temp: 98.2 F (36.8 C)  TempSrc: Oral  Resp: 16  Height: 5' 3.5" (1.613 m)  Weight: 223 lb (101.152 kg)  SpO2: 98%    GENERAL: The patient is a well-nourished female, in no acute distress. The vital signs are documented above. CARDIAC: There is a regular rate and rhythm.  VASCULAR: 2+ radial and 2+ posterior tibial pulses bilaterally. 1+ dorsalis pedis pulses bilaterally PULMONARY: There is good air exchange bilaterally without wheezing or rales. ABDOMEN: Soft and non-tender  with normal pitched bowel sounds.  MUSCULOSKELETAL: There are no  major deformities or cyanosis. NEUROLOGIC: No focal weakness or paresthesias are detected. SKIN: Some pitting edema of both lower extremities. She does have a callus appearing thickened skin on the plantar aspect of her left great toe. She has had peeling of the superficial skin over the dorsum of her toe. PSYCHIATRIC: The patient has a normal affect.  DATA:  Venous studies today were obtained in our office and discussed with the patient. This shows no evidence of DVT. She has no evidence of superficial reflux bilaterally. She has essentially normal deep veins with some reflux in her right common femoral vein but no other reflux noted in her femoral vein or popliteal veins.  MEDICAL ISSUES: . Edema and very slow to heal (ulceration. Explained that she does not have any evidence of arterial or venous pathology to account this. This certainly does not look like lymphedema. She is frustrated at the prolonged recovery of this. I did suggest attempted compression garments. She reports that she is unable to wear these because her neuropathy makes this is intolerable. I did encourage her to continue with attempts at elevation when possible. I do suspect that she would have improvement with increasing her diuresis and she will check with her primary care physician about this. We will see her   Rafiel Mecca [facility] [employer]: [phone]

## 2015-05-26 DIAGNOSIS — M5136 Other intervertebral disc degeneration, lumbar region: Secondary | ICD-10-CM | POA: Diagnosis not present

## 2015-05-26 DIAGNOSIS — G894 Chronic pain syndrome: Secondary | ICD-10-CM | POA: Diagnosis not present

## 2015-05-26 DIAGNOSIS — M961 Postlaminectomy syndrome, not elsewhere classified: Secondary | ICD-10-CM | POA: Diagnosis not present

## 2015-05-26 DIAGNOSIS — M4806 Spinal stenosis, lumbar region: Secondary | ICD-10-CM | POA: Diagnosis not present

## 2015-06-08 DIAGNOSIS — M5136 Other intervertebral disc degeneration, lumbar region: Secondary | ICD-10-CM | POA: Diagnosis not present

## 2015-06-14 ENCOUNTER — Encounter: Payer: Self-pay | Admitting: Internal Medicine

## 2015-06-15 NOTE — Telephone Encounter (Signed)
Sheryl Copeland to see above

## 2015-06-16 ENCOUNTER — Telehealth: Payer: Self-pay

## 2015-06-16 MED ORDER — NABUMETONE 500 MG PO TABS: 500.0000 mg | ORAL_TABLET | Freq: Every day | ORAL | Status: DC

## 2015-06-16 MED ORDER — HYDROCHLOROTHIAZIDE 12.5 MG PO CAPS: 25.0000 mg | ORAL_CAPSULE | Freq: Every day | ORAL | Status: DC

## 2015-06-16 MED ORDER — ATENOLOL 50 MG PO TABS: ORAL_TABLET | ORAL | Status: DC

## 2015-06-16 MED ORDER — POTASSIUM CHLORIDE ER 10 MEQ PO CPCR: 10.0000 meq | ORAL_CAPSULE | Freq: Every day | ORAL | Status: DC

## 2015-06-16 MED ORDER — THYROID 120 MG PO TABS: 120.0000 mg | ORAL_TABLET | Freq: Every day | ORAL | Status: DC

## 2015-06-16 MED ORDER — VITAMIN D3 25 MCG (1000 UT) PO CAPS: 2000.0000 [IU] | ORAL_CAPSULE | Freq: Every day | ORAL | Status: AC

## 2015-06-16 NOTE — Telephone Encounter (Signed)
Medications reordered.

## 2015-06-17 ENCOUNTER — Encounter: Payer: Self-pay | Admitting: Internal Medicine

## 2015-06-21 MED ORDER — POTASSIUM CHLORIDE ER 10 MEQ PO CPCR: 10.0000 meq | ORAL_CAPSULE | Freq: Every day | ORAL | Status: DC

## 2015-06-21 MED ORDER — ATENOLOL 50 MG PO TABS: ORAL_TABLET | ORAL | Status: DC

## 2015-06-21 MED ORDER — NABUMETONE 500 MG PO TABS: 500.0000 mg | ORAL_TABLET | Freq: Every day | ORAL | Status: DC

## 2015-06-21 MED ORDER — HYDROCHLOROTHIAZIDE 12.5 MG PO CAPS
25.0000 mg | ORAL_CAPSULE | Freq: Every day | ORAL | Status: DC
Start: 2015-06-21 — End: 2016-05-25

## 2015-06-21 MED ORDER — THYROID 120 MG PO TABS: 120.0000 mg | ORAL_TABLET | Freq: Every day | ORAL | Status: DC

## 2015-06-21 NOTE — Telephone Encounter (Signed)
I saw pt my chart message.   [facility]. Called [facility] and deleted those rx's.   Called and confirmed erx to [facility] was received and they were.   Sent my chart message to pt and called pt contact number and informed pt of all above.

## 2015-06-27 DIAGNOSIS — G894 Chronic pain syndrome: Secondary | ICD-10-CM | POA: Diagnosis not present

## 2015-06-27 DIAGNOSIS — M4806 Spinal stenosis, lumbar region: Secondary | ICD-10-CM | POA: Diagnosis not present

## 2015-06-27 DIAGNOSIS — M5136 Other intervertebral disc degeneration, lumbar region: Secondary | ICD-10-CM | POA: Diagnosis not present

## 2015-06-27 DIAGNOSIS — M961 Postlaminectomy syndrome, not elsewhere classified: Secondary | ICD-10-CM | POA: Diagnosis not present

## 2015-06-30 ENCOUNTER — Other Ambulatory Visit (INDEPENDENT_AMBULATORY_CARE_PROVIDER_SITE_OTHER): Payer: PPO

## 2015-06-30 ENCOUNTER — Encounter: Payer: Self-pay | Admitting: Internal Medicine

## 2015-06-30 ENCOUNTER — Ambulatory Visit (INDEPENDENT_AMBULATORY_CARE_PROVIDER_SITE_OTHER): Payer: PPO | Admitting: Internal Medicine

## 2015-06-30 VITALS — BP 136/80 | HR 69 | Temp 98.2°F | Resp 20 | Wt 224.0 lb

## 2015-06-30 DIAGNOSIS — G8929 Other chronic pain: Secondary | ICD-10-CM

## 2015-06-30 DIAGNOSIS — M545 Low back pain, unspecified: Secondary | ICD-10-CM

## 2015-06-30 DIAGNOSIS — I10 Essential (primary) hypertension: Secondary | ICD-10-CM

## 2015-06-30 DIAGNOSIS — R7302 Impaired glucose tolerance (oral): Secondary | ICD-10-CM

## 2015-06-30 DIAGNOSIS — Z0000 Encounter for general adult medical examination without abnormal findings: Secondary | ICD-10-CM

## 2015-06-30 DIAGNOSIS — J4520 Mild intermittent asthma, uncomplicated: Secondary | ICD-10-CM

## 2015-06-30 DIAGNOSIS — E785 Hyperlipidemia, unspecified: Secondary | ICD-10-CM

## 2015-06-30 DIAGNOSIS — Z01812 Encounter for preprocedural laboratory examination: Secondary | ICD-10-CM | POA: Diagnosis not present

## 2015-06-30 LAB — BASIC METABOLIC PANEL
BUN: 20 mg/dL (ref 6–23)
CALCIUM: 10 mg/dL (ref 8.4–10.5)
CO2: 32 mEq/L (ref 19–32)
CREATININE: 0.89 mg/dL (ref 0.40–1.20)
Chloride: 100 mEq/L (ref 96–112)
GFR: 66.52 mL/min (ref >60.00)
GLUCOSE: 108 mg/dL — AB (ref 70–99)
Potassium: 3.9 mEq/L (ref 3.5–5.1)
SODIUM: 139 meq/L (ref 135–145)

## 2015-06-30 LAB — CBC WITH DIFFERENTIAL/PLATELET
BASOS ABS: 0 10*3/uL (ref 0.0–0.1)
Basophils Relative: 0.4 % (ref 0.0–3.0)
EOS ABS: 0.4 10*3/uL (ref 0.0–0.7)
Eosinophils Relative: 4.9 % (ref 0.0–5.0)
HEMATOCRIT: 38.1 % (ref 36.0–46.0)
Hemoglobin: 12.9 g/dL (ref 12.0–15.0)
LYMPHS PCT: 28.3 % (ref 12.0–46.0)
Lymphs Abs: 2.4 10*3/uL (ref 0.7–4.0)
MCHC: 34 g/dL (ref 30.0–36.0)
MCV: 88.3 fl (ref 78.0–100.0)
MONOS PCT: 13.1 % — AB (ref 3.0–12.0)
Monocytes Absolute: 1.1 10*3/uL — ABNORMAL HIGH (ref 0.1–1.0)
NEUTROS PCT: 53.3 % (ref 43.0–77.0)
Neutro Abs: 4.5 10*3/uL (ref 1.4–7.7)
PLATELETS: 250 10*3/uL (ref 150.0–400.0)
RBC: 4.32 Mil/uL (ref 3.87–5.11)
RDW: 12.8 % (ref 11.5–15.5)
WBC: 8.5 10*3/uL (ref 4.0–10.5)

## 2015-06-30 LAB — HEMOGLOBIN A1C: Hgb A1c MFr Bld: 5.9 % (ref 4.6–6.5)

## 2015-06-30 LAB — URINALYSIS, ROUTINE W REFLEX MICROSCOPIC
BILIRUBIN URINE: NEGATIVE
KETONES UR: NEGATIVE
LEUKOCYTES UA: NEGATIVE
Nitrite: NEGATIVE
PH: 6 (ref 5.0–8.0)
RBC / HPF: NONE SEEN (ref 0–?)
SPECIFIC GRAVITY, URINE: 1.02 (ref 1.000–1.030)
Total Protein, Urine: NEGATIVE
URINE GLUCOSE: NEGATIVE
UROBILINOGEN UA: 0.2 (ref 0.0–1.0)
WBC UA: NONE SEEN (ref 0–?)

## 2015-06-30 LAB — LIPID PANEL
CHOLESTEROL: 136 mg/dL (ref 0–200)
HDL: 30.7 mg/dL — AB (ref >39.00)
LDL CALC: 74 mg/dL (ref 0–99)
NONHDL: 105.56
Total CHOL/HDL Ratio: 4
Triglycerides: 158 mg/dL — ABNORMAL HIGH (ref 0.0–149.0)
VLDL: 31.6 mg/dL (ref 0.0–40.0)

## 2015-06-30 LAB — HEPATIC FUNCTION PANEL
ALBUMIN: 4 g/dL (ref 3.5–5.2)
ALT: 23 U/L (ref 0–35)
AST: 25 U/L (ref 0–37)
Alkaline Phosphatase: 42 U/L (ref 39–117)
Bilirubin, Direct: 0.1 mg/dL (ref 0.0–0.3)
TOTAL PROTEIN: 8 g/dL (ref 6.0–8.3)
Total Bilirubin: 0.5 mg/dL (ref 0.2–1.2)

## 2015-06-30 LAB — TSH: TSH: 3.01 u[IU]/mL (ref 0.35–4.50)

## 2015-06-30 MED ORDER — ALBUTEROL SULFATE HFA 108 (90 BASE) MCG/ACT IN AERS: 2.0000 | INHALATION_SPRAY | Freq: Four times a day (QID) | RESPIRATORY_TRACT | Status: DC | PRN

## 2015-06-30 MED ORDER — TRAMADOL HCL 50 MG PO TABS: 50.0000 mg | ORAL_TABLET | Freq: Three times a day (TID) | ORAL | Status: DC | PRN

## 2015-06-30 NOTE — Progress Notes (Signed)
Pre visit review using our clinic review tool, if applicable. No additional management support is needed unless otherwise documented below in the visit note. 

## 2015-06-30 NOTE — Assessment & Plan Note (Signed)
stable overall by history and exam, recent data reviewed with pt, and pt to continue medical treatment as before,  to f/u any worsening symptoms or concerns Lab Results  Component Value Date   LDLCALC 82 12/28/2014

## 2015-06-30 NOTE — Assessment & Plan Note (Signed)

## 2015-06-30 NOTE — Progress Notes (Signed)
Subjective:    Patient ID: Sheryl Copeland, female    DOB: [birth year], 70 y.o.   MRN: [id number]  HPI    Here for wellness and f/u;  Overall doing ok;  Pt denies Chest pain, worsening orthopnea, PND, worsening LE edema, palpitations, dizziness or syncope, except now with mild wheezing/sob seems seasonal. Out of albut MDI.Marland Kitchen  Pt denies neurological change such as new headache, facial or extremity weakness.  Pt denies polydipsia, polyuria, or low sugar symptoms. Pt states overall good compliance with treatment and medications, good tolerability, and has been trying to follow appropriate diet.  Pt denies worsening depressive symptoms, suicidal ideation or panic. No fever, night sweats, wt loss, loss of appetite, or other constitutional symptoms.  Pt states good ability with ADL's, has low fall risk, home safety reviewed and adequate, no other significant changes in hearing or vision, and not active with exercise.  Has had significant bilat knee and lower back pain, bilat leg swelling and nonhealing ulcer left great toe.  Has chronic pain, needs tramadol prn. Denies worsening depressive symptoms, suicidal ideation, or panic; has ongoing stressors, but delcines any tx, including cymbalta for pain.  Pt denies fever, wt loss, night sweats, loss of appetite, or other constitutional symptoms Past Medical History  Diagnosis Date  . Acute meniscal tear of knee sept 2012    right knee  . Arthritis of foot, right     first mtp  . Blood in stool   . Migraine   . Cervical disc disease     s/p cervical spine surgury 1990  . Lumbar disc disease     s/p lumbar fusion 1999  . Allergy   . Asthma   . Hypertension   . Hypothyroidism   . IBS (irritable bowel syndrome) 07/03/2011  . Peripheral neuropathy (HCC) 07/03/2011  . Impaired glucose tolerance 07/03/2011  . 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 Insomnia 07/15/2011  . Chronic low back pain 07/15/2011  . Hyperlipidemia 07/15/2011  . Restrictive  lung disease 07/15/2011   Past Surgical History  Procedure Laterality Date  . Lumbar fusion    . Cholecystectomy  1967  . Appendectomy  1962  . Tonsillectomy  1967  . Abdominal hysterectomy  1974  . Carpal tunnel release      x 2  . Cervical laminectomy    . Hemorrhoid surgery      reports that she has never smoked. She has never used smokeless tobacco. She reports that she does not drink alcohol or use illicit drugs. family history includes Alcohol abuse in her other and other; Arthritis in her other and other; Cancer in her other and other; Diabetes in her other; Heart disease in her other; Hyperlipidemia in her other; Hypertension in her other and other; Mental illness in her other; Stroke in her other. There is no history of Colon cancer. Allergies  Allergen Reactions  . Aspirin Swelling    Throat closes  . Celebrex [Celecoxib] Hives  . Dipyridamole     Per pt: "opposite effect"  . Eggs Or Egg-Derived Products     diarrhea  . Erythromycin Hives  . Gabapentin     Per pt: uknown  . Iodine Swelling    Throat closes  . Penicillins Diarrhea   Current Outpatient Prescriptions on File Prior to Visit  Medication Sig Dispense Refill  . atenolol (TENORMIN) 50 MG tablet Take 1 tablet by mouth  daily 90 tablet 1  . BIOTIN PO Take 2 tablets by  mouth daily.     . Cholecalciferol (VITAMIN D3) 1000 units CAPS Take 2 capsules (2,000 Units total) by mouth daily. 60 capsule 0  . EPIPEN 2-PAK 0.3 MG/0.3ML SOAJ injection     . hydrochlorothiazide (MICROZIDE) 12.5 MG capsule Take 2 capsules (25 mg total) by mouth daily. 180 capsule 1  . nabumetone (RELAFEN) 500 MG tablet Take 1 tablet (500 mg total) by mouth daily. 90 tablet 1  . potassium chloride (MICRO-K) 10 MEQ CR capsule Take 1 capsule (10 mEq total) by mouth daily. 90 capsule 1  . thyroid (ARMOUR THYROID) 120 MG tablet Take 1 tablet (120 mg total) by mouth daily before breakfast. 90 tablet 1   No current facility-administered  medications on file prior to visit.     Review of Systems Constitutional: Negative for increased diaphoresis, other activity, appetite or siginficant weight change other than noted HENT: Negative for worsening hearing loss, ear pain, facial swelling, mouth sores and neck stiffness.   Eyes: Negative for other worsening pain, redness or visual disturbance.  Respiratory: Negative for shortness of breath and wheezing  Cardiovascular: Negative for chest pain and palpitations.  Gastrointestinal: Negative for diarrhea, blood in stool, abdominal distention or other pain Genitourinary: Negative for hematuria, flank pain or change in urine volume.  Musculoskeletal: Negative for myalgias or other joint complaints.  Skin: Negative for color change and wound or drainage.  Neurological: Negative for syncope and numbness. other than noted Hematological: Negative for adenopathy. or other swelling Psychiatric/Behavioral: Negative for hallucinations, SI, self-injury, decreased concentration or other worsening agitation.      Objective:   Physical Exam BP 136/80 mmHg  Pulse 69  Temp(Src) 98.2 F (36.8 C) (Oral)  Resp 20  Wt 224 lb (101.606 kg)  SpO2 95% VS noted, not ill appearing  Constitutional: Pt is oriented to person, place, and time. Appears well-developed and well-nourished, in no significant distress Head: Normocephalic and atraumatic.  Right Ear: External ear normal.  Left Ear: External ear normal.  Nose: Nose normal.  Mouth/Throat: Oropharynx is clear and moist.  Eyes: Conjunctivae and EOM are normal. Pupils are equal, round, and reactive to light.  Neck: Normal range of motion. Neck supple. No JVD present. No tracheal deviation present or significant neck LA or mass Cardiovascular: Normal rate, regular rhythm, normal heart sounds and intact distal pulses.   Pulmonary/Chest: Effort normal and breath sounds decreasede without rales or wheezing  Abdominal: Soft. Bowel sounds are normal.  NT. No HSM  Musculoskeletal: Normal range of motion. Exhibits no edema.  Lymphadenopathy:  Has no cervical adenopathy.  Neurological: Pt is alert and oriented to person, place, and time. Pt has normal reflexes. No cranial nerve deficit. Motor grossly intact Skin: Skin is warm and dry. No rash noted.  Psychiatric:  Has normal mood and affect. Behavior is normal.     Assessment & Plan:

## 2015-06-30 NOTE — Assessment & Plan Note (Signed)
stable overall by history and exam, recent data reviewed with pt, and pt to continue medical treatment as before,  to f/u any worsening symptoms or concerns Lab Results  Component Value Date   HGBA1C 5.4 12/28/2014

## 2015-06-30 NOTE — Assessment & Plan Note (Signed)
Ok for refill proair hfa,  to f/u any worsening symptoms or concerns

## 2015-06-30 NOTE — Assessment & Plan Note (Signed)
And knee pain as well, to f/u Dr Ethelene Hal [facility] as planned, also tramadol prn,  to f/u any worsening symptoms or concerns

## 2015-06-30 NOTE — Assessment & Plan Note (Signed)
stable overall by history and exam, recent data reviewed with pt, and pt to continue medical treatment as before,  to f/u any worsening symptoms or concerns BP Readings from Last 3 Encounters:  06/30/15 136/80  04/12/15 139/79  03/21/15 144/78

## 2015-06-30 NOTE — Patient Instructions (Addendum)
Please take all new medication as prescribed  - the albuterol inhaler  Please continue all other medications as before, and refills have been done if requested - the tramadol  Please have the pharmacy call with any other refills you may need.  Please continue your efforts at being more active, low cholesterol diet, and weight control.  You are otherwise up to date with prevention measures today.  Please keep your appointments with your specialists as you may have planned  Please go to the LAB in the Basement (turn left off the elevator) for the tests to be done today  You will be contacted by phone if any changes need to be made immediately.  Otherwise, you will receive a letter about your results with an explanation, but please check with MyChart first.  Please remember to sign up for MyChart if you have not done so, as this will be important to you in the future with finding out test results, communicating by private email, and scheduling acute appointments online when needed.  Please return in 6 months, or sooner if needed

## 2015-07-01 LAB — HEPATITIS C ANTIBODY: HCV AB: NEGATIVE

## 2015-07-04 DIAGNOSIS — M5136 Other intervertebral disc degeneration, lumbar region: Secondary | ICD-10-CM | POA: Diagnosis not present

## 2015-07-11 DIAGNOSIS — M5416 Radiculopathy, lumbar region: Secondary | ICD-10-CM | POA: Diagnosis not present

## 2015-07-11 DIAGNOSIS — M961 Postlaminectomy syndrome, not elsewhere classified: Secondary | ICD-10-CM | POA: Diagnosis not present

## 2015-07-11 DIAGNOSIS — M5136 Other intervertebral disc degeneration, lumbar region: Secondary | ICD-10-CM | POA: Diagnosis not present

## 2015-07-11 DIAGNOSIS — M4806 Spinal stenosis, lumbar region: Secondary | ICD-10-CM | POA: Diagnosis not present

## 2015-07-16 IMAGING — CR DG KNEE COMPLETE 4+V*R*
4 series · 4 of 4 positions shown · non-contrast
Comparison: MR KNEE*R* W/O CM dated 01/13/2011; DG KNEE COMPLETE 4
VIEWS*R* dated 01/10/2011

CLINICAL DATA: Knee pain.

EXAM:
RIGHT KNEE - COMPLETE 4+ VIEW

[x knee ap right]
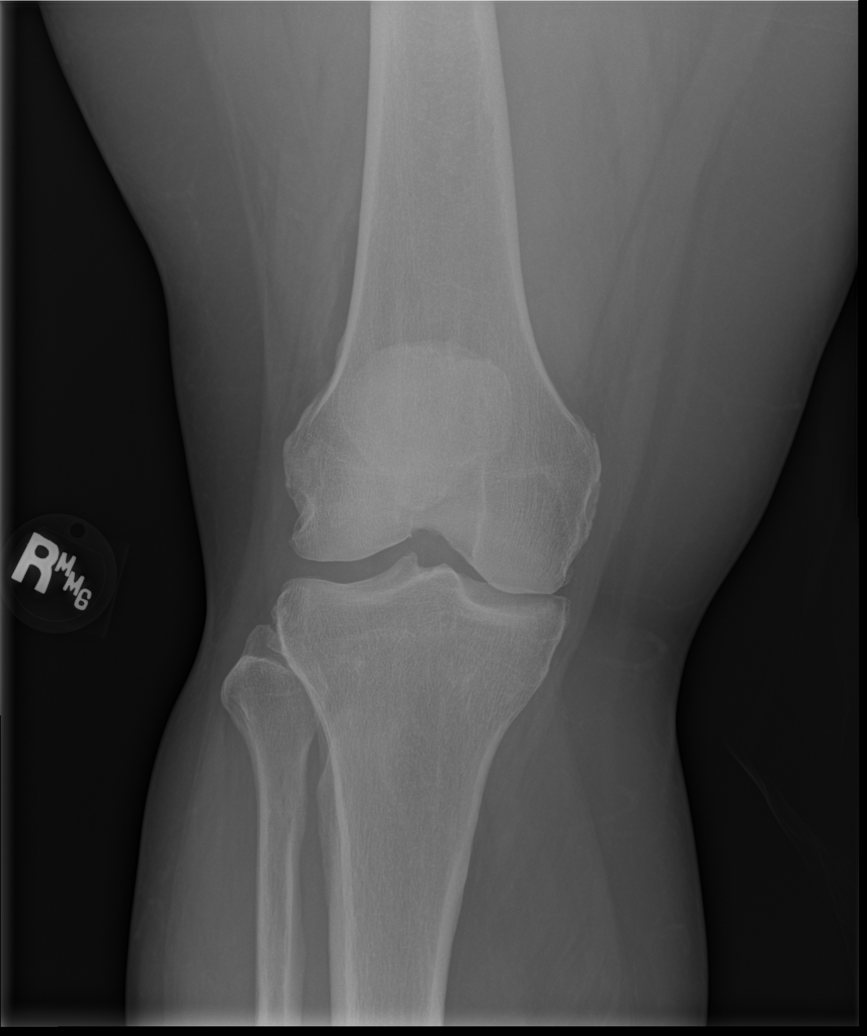

[x knee obl right (1 of 2)]
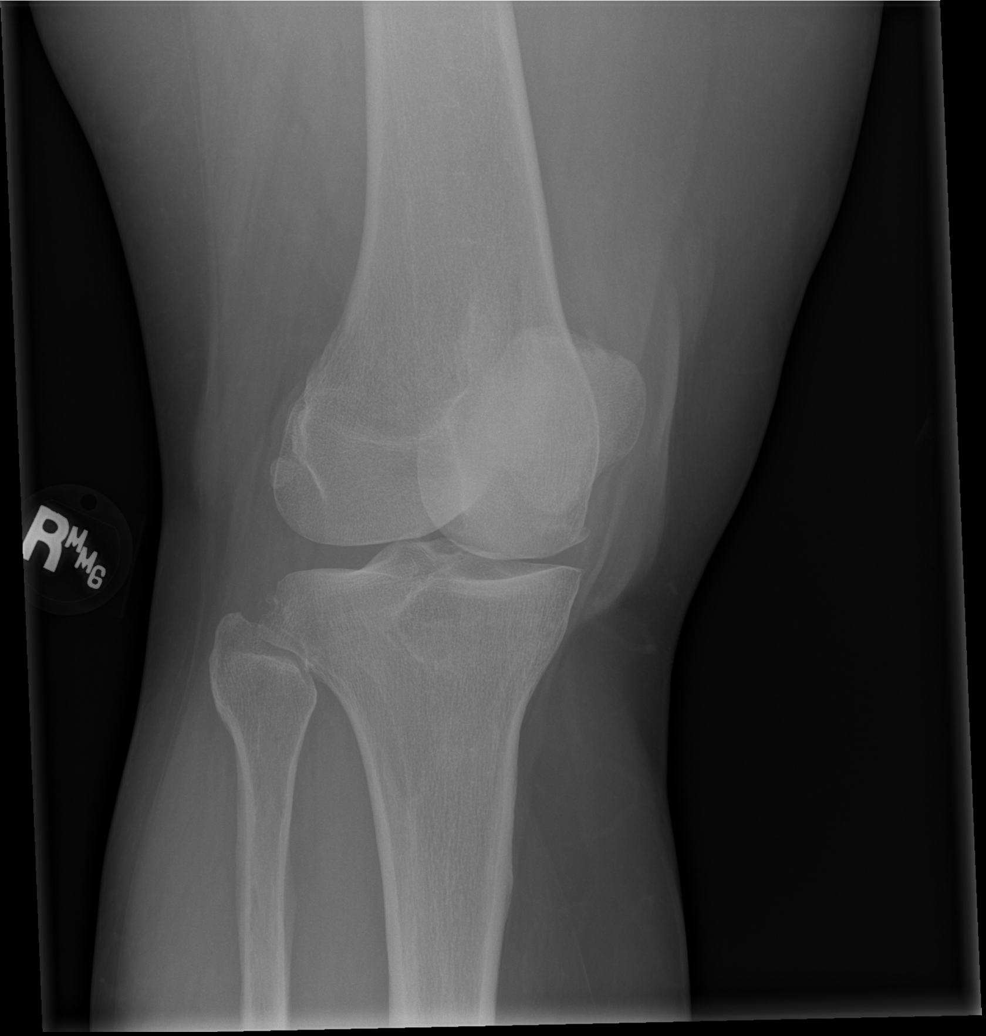

[x knee obl right (2 of 2)]
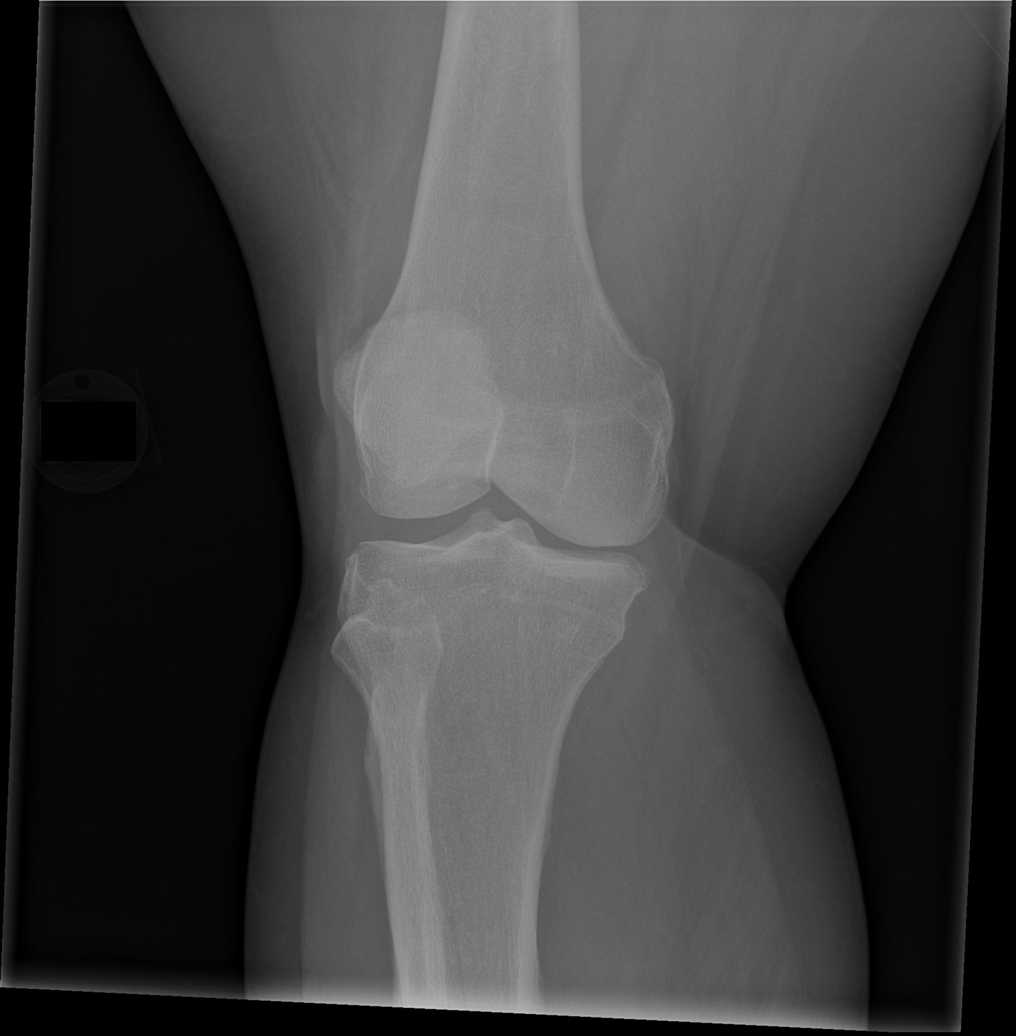

[x knee lat right]
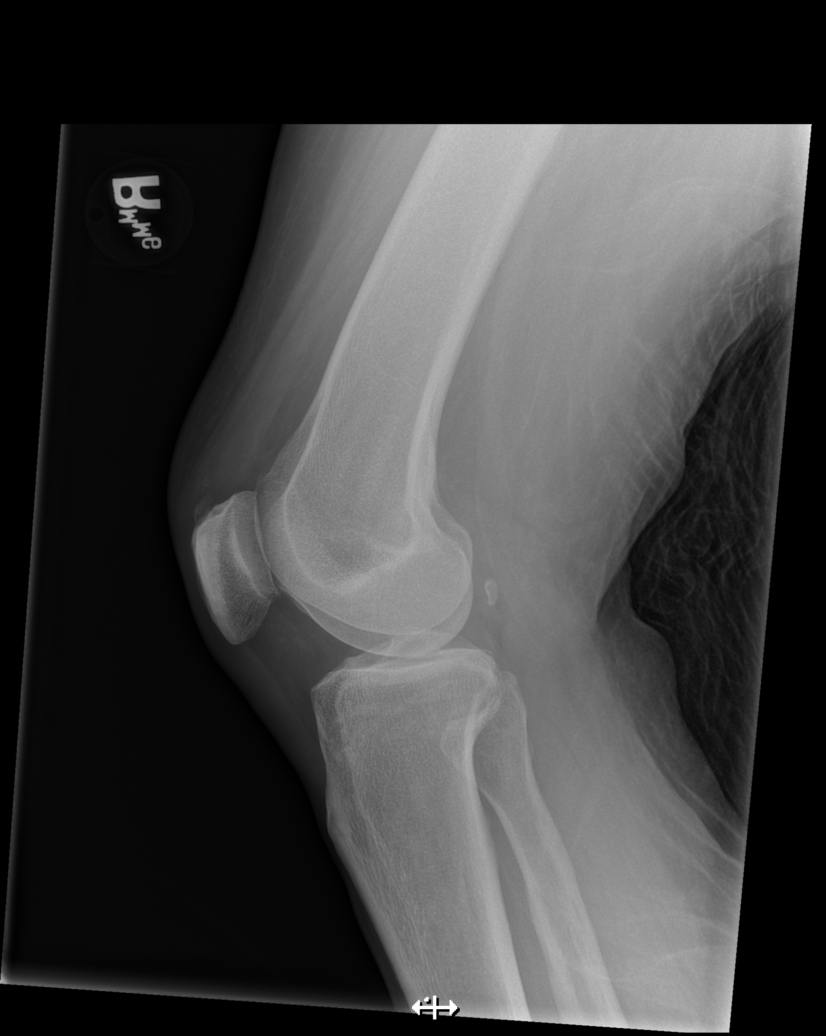

[4 of 4 positions shown; findings below may reference images not displayed]

FINDINGS: Early degenerative changes, best seen in the medial compartment with
joint space narrowing and early spurring. No acute bony abnormality.
Specifically, no fracture, subluxation, or dislocation. Soft tissues
are intact.
IMPRESSION: No acute bony abnormality.

## 2015-07-22 DIAGNOSIS — M5136 Other intervertebral disc degeneration, lumbar region: Secondary | ICD-10-CM | POA: Diagnosis not present

## 2015-07-22 DIAGNOSIS — M5432 Sciatica, left side: Secondary | ICD-10-CM | POA: Diagnosis not present

## 2015-07-22 DIAGNOSIS — M4806 Spinal stenosis, lumbar region: Secondary | ICD-10-CM | POA: Diagnosis not present

## 2015-08-09 DIAGNOSIS — M4806 Spinal stenosis, lumbar region: Secondary | ICD-10-CM | POA: Diagnosis not present

## 2015-08-09 DIAGNOSIS — M5432 Sciatica, left side: Secondary | ICD-10-CM | POA: Diagnosis not present

## 2015-08-09 DIAGNOSIS — M5136 Other intervertebral disc degeneration, lumbar region: Secondary | ICD-10-CM | POA: Diagnosis not present

## 2015-08-09 DIAGNOSIS — M5416 Radiculopathy, lumbar region: Secondary | ICD-10-CM | POA: Diagnosis not present

## 2015-11-23 ENCOUNTER — Telehealth: Payer: Self-pay | Admitting: Emergency Medicine

## 2015-11-23 MED ORDER — THYROID 120 MG PO TABS: 120.0000 mg | ORAL_TABLET | Freq: Every day | ORAL | Status: DC

## 2015-11-23 NOTE — Telephone Encounter (Signed)
Pt called and needs a refill on thyroid (ARMOUR THYROID) 120 MG tablet. [facility]. Please follow up thanks.

## 2015-11-23 NOTE — Telephone Encounter (Signed)
Medication refill sent to pharmacy  

## 2015-12-06 ENCOUNTER — Telehealth: Payer: Self-pay | Admitting: Emergency Medicine

## 2015-12-06 NOTE — Telephone Encounter (Signed)
Pt called and needs a refill on atenolol (TENORMIN) 50 MG tablet. Pharmacy is [facility]. Please follow up thanks.

## 2015-12-07 MED ORDER — ATENOLOL 50 MG PO TABS: ORAL_TABLET | ORAL | 1 refills | Status: DC

## 2015-12-07 NOTE — Telephone Encounter (Signed)
Medication refill sent to pharmacy  

## 2016-02-17 ENCOUNTER — Other Ambulatory Visit: Payer: Self-pay | Admitting: Internal Medicine

## 2016-02-17 DIAGNOSIS — Z1231 Encounter for screening mammogram for malignant neoplasm of breast: Secondary | ICD-10-CM

## 2016-03-01 ENCOUNTER — Ambulatory Visit
Admission: RE | Admit: 2016-03-01 | Discharge: 2016-03-01 | Disposition: A | Discharge status: Home / Self Care | Payer: PPO | Source: Ambulatory Visit | Attending: Internal Medicine | Admitting: Internal Medicine

## 2016-03-01 DIAGNOSIS — Z1231 Encounter for screening mammogram for malignant neoplasm of breast: Secondary | ICD-10-CM | POA: Diagnosis not present

## 2016-05-25 ENCOUNTER — Ambulatory Visit (INDEPENDENT_AMBULATORY_CARE_PROVIDER_SITE_OTHER): Payer: PPO | Admitting: Internal Medicine

## 2016-05-25 ENCOUNTER — Encounter: Payer: Self-pay | Admitting: Internal Medicine

## 2016-05-25 VITALS — BP 138/78 | HR 74 | Temp 97.9°F | Resp 20 | Wt 215.0 lb

## 2016-05-25 DIAGNOSIS — J018 Other acute sinusitis: Secondary | ICD-10-CM | POA: Diagnosis not present

## 2016-05-25 DIAGNOSIS — S91102A Unspecified open wound of left great toe without damage to nail, initial encounter: Secondary | ICD-10-CM | POA: Diagnosis not present

## 2016-05-25 DIAGNOSIS — G629 Polyneuropathy, unspecified: Secondary | ICD-10-CM | POA: Diagnosis not present

## 2016-05-25 DIAGNOSIS — R7302 Impaired glucose tolerance (oral): Secondary | ICD-10-CM

## 2016-05-25 MED ORDER — THYROID 120 MG PO TABS: 120.0000 mg | ORAL_TABLET | Freq: Every day | ORAL | 1 refills | Status: DC

## 2016-05-25 MED ORDER — POTASSIUM CHLORIDE ER 10 MEQ PO CPCR: 10.0000 meq | ORAL_CAPSULE | Freq: Every day | ORAL | 1 refills | Status: DC

## 2016-05-25 MED ORDER — HYDROCHLOROTHIAZIDE 12.5 MG PO CAPS: 25.0000 mg | ORAL_CAPSULE | Freq: Every day | ORAL | 1 refills | Status: DC

## 2016-05-25 MED ORDER — ALBUTEROL SULFATE HFA 108 (90 BASE) MCG/ACT IN AERS: 2.0000 | INHALATION_SPRAY | Freq: Four times a day (QID) | RESPIRATORY_TRACT | 2 refills | Status: AC | PRN

## 2016-05-25 MED ORDER — NABUMETONE 500 MG PO TABS: 500.0000 mg | ORAL_TABLET | Freq: Every day | ORAL | 1 refills | Status: DC

## 2016-05-25 MED ORDER — ATENOLOL 50 MG PO TABS: ORAL_TABLET | ORAL | 1 refills | Status: DC

## 2016-05-25 MED ORDER — SILVER SULFADIAZINE 1 % EX CREA: 1.0000 "application " | TOPICAL_CREAM | Freq: Every day | CUTANEOUS | 0 refills | Status: DC

## 2016-05-25 MED ORDER — TRAMADOL HCL 50 MG PO TABS: 50.0000 mg | ORAL_TABLET | Freq: Three times a day (TID) | ORAL | 1 refills | Status: DC | PRN

## 2016-05-25 MED ORDER — LEVOFLOXACIN 250 MG PO TABS: 250.0000 mg | ORAL_TABLET | Freq: Every day | ORAL | 0 refills | Status: AC

## 2016-05-25 NOTE — Patient Instructions (Signed)
Please take all new medication as prescribed - the antibiotic  Please continue all other medications as before, and refills have been done if requested - the pain medication  Please have the pharmacy call with any other refills you may need.  Please keep your appointments with your specialists as you may have planned  You will be contacted regarding the referral for: wound clinic  [facility]   Please return after Feb 23, or sooner if needed, with Lab testing done 3-5 days before

## 2016-05-25 NOTE — Assessment & Plan Note (Signed)
Persistent without worsening pain, has significant reduced sensation to LT, ok for handicap parking application filled out today

## 2016-05-25 NOTE — Assessment & Plan Note (Signed)
Chronic persistent, no hx of DM or PAD but nonhealing, for referral wound clinic

## 2016-05-25 NOTE — Progress Notes (Signed)
Pre visit review using our clinic review tool, if applicable. No additional management support is needed unless otherwise documented below in the visit note. 

## 2016-05-25 NOTE — Progress Notes (Signed)
Subjective:    Patient ID: Sheryl Copeland, female    DOB: [birth year], 71 y.o.   MRN: [id number]  HPI   Here with 2-3 days acute onset fever, facial pain, pressure, headache, general weakness and malaise, and greenish d/c, with mild ST and cough, but pt denies chest pain, wheezing, increased sob or doe, orthopnea, PND, increased LE swelling, palpitations, dizziness or syncope.  Also has know persistent peripheral neuropathy, getting more difficult to drive or ambulate though not using cane or other assistive device today, asks for handicap parking application.  Also has 3 yrs persistent left great toe ulceration on top of what sounds like recurrent gout left great toe, usually resolved with colchicine.  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  Pt denies polydipsia, polyuria   No other new history Past Medical History:  Diagnosis Date  . Acute meniscal tear of knee sept 2012   right knee  . Allergy   . Arthritis of foot, right    first mtp  . Asthma   . Blood in stool   . Cervical disc disease    s/p cervical spine surgury 1990  . Chronic low back pain 07/15/2011  . 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 Hyperlipidemia 07/15/2011  . Hypertension   . Hypothyroidism   . IBS (irritable bowel syndrome) 07/03/2011  . Impaired glucose tolerance 07/03/2011  . Insomnia 07/15/2011  . Lumbar disc disease    s/p lumbar fusion 1999  . Migraine   . Peripheral neuropathy (HCC) 07/03/2011  . Restrictive lung disease 07/15/2011   Past Surgical History:  Procedure Laterality Date  . ABDOMINAL HYSTERECTOMY  1974  . APPENDECTOMY  1962  . CARPAL TUNNEL RELEASE     x 2  . CERVICAL LAMINECTOMY    . CHOLECYSTECTOMY  1967  . HEMORRHOID SURGERY    . LUMBAR FUSION    . TONSILLECTOMY  1967    reports that she has never smoked. She has never used smokeless tobacco. She reports that she  does not drink alcohol or use drugs. family history includes Alcohol abuse in her other and other; Arthritis in her other and other; Cancer in her other and other; Diabetes in her other; Heart disease in her other; Hyperlipidemia in her other; Hypertension in her other and other; Mental illness in her other; Stroke in her other. Allergies  Allergen Reactions  . Aspirin Swelling    Throat closes  . Celebrex [Celecoxib] Hives  . Dipyridamole     Per pt: "opposite effect"  . Eggs Or Egg-Derived Products     diarrhea  . Erythromycin Hives  . Gabapentin     Per pt: uknown  . Iodine Swelling    Throat closes  . Penicillins Diarrhea   Current Outpatient Prescriptions on File Prior to Visit  Medication Sig Dispense Refill  . BIOTIN PO Take 2 tablets by mouth daily.     . Cholecalciferol (VITAMIN D3) 1000 units CAPS Take 2 capsules (2,000 Units total) by mouth daily. 60 capsule 0  . EPIPEN 2-PAK 0.3 MG/0.3ML SOAJ injection      No current facility-administered medications on file prior to visit.    Review of Systems  Constitutional: Negative for unusual diaphoresis or night sweats HENT: Negative for ear swelling or discharge Eyes: Negative for worsening visual haziness  Respiratory: Negative for choking and stridor.   Gastrointestinal: Negative for distension or worsening eructation Genitourinary:  Negative for retention or change in urine volume.  Musculoskeletal: Negative for other MSK pain or swelling Skin: Negative for color change and worsening wound Neurological: Negative for tremors and numbness other than noted  Psychiatric/Behavioral: Negative for decreased concentration or agitation other than above   All other system neg per pt    Objective:   Physical Exam BP 138/78   Pulse 74   Temp 97.9 F (36.6 C) (Oral)   Resp 20   Wt 215 lb (97.5 kg)   SpO2 95%   BMI 37.49 kg/m  VS noted, mild ill appearing Constitutional: Pt appears in no apparent distress HENT: Head: NCAT.   Right Ear: External ear normal.  Left Ear: External ear normal.  Eyes: . Pupils are equal, round, and reactive to light. Conjunctivae and EOM are normal Bilat tm's with mild erythema.  Max sinus areas mild tender.  Pharynx with mild erythema, no exudate Neck: Normal range of motion. Neck supple.  Cardiovascular: Normal rate and regular rhythm.   Pulmonary/Chest: Effort normal and breath sounds without rales or wheezing.  Neurological: Pt is alert. Not confused , motor grossly intact Left great toe with ulceration distal without drainage or red streaks, no worsening swelling such as c/w gout Skin: Skin is warm. No rash, no LE edema Psychiatric: Pt behavior is normal. No agitation.  No other new exam findings    Assessment & Plan:

## 2016-05-25 NOTE — Assessment & Plan Note (Signed)
stable overall by history and exam, recent data reviewed with pt, and pt to continue medical treatment as before,  to f/u any worsening symptoms or concerns Lab Results  Component Value Date   HGBA1C 5.9 06/30/2015

## 2016-05-25 NOTE — Assessment & Plan Note (Signed)
Mild to mod, for antibx course,  to f/u any worsening symptoms or concerns 

## 2016-05-30 ENCOUNTER — Encounter: Payer: Self-pay | Admitting: Internal Medicine

## 2016-06-11 ENCOUNTER — Encounter (HOSPITAL_BASED_OUTPATIENT_CLINIC_OR_DEPARTMENT_OTHER): Payer: PPO

## 2016-07-23 ENCOUNTER — Other Ambulatory Visit: Payer: Self-pay | Admitting: Emergency Medicine

## 2016-07-23 ENCOUNTER — Other Ambulatory Visit (INDEPENDENT_AMBULATORY_CARE_PROVIDER_SITE_OTHER): Payer: PPO

## 2016-07-23 DIAGNOSIS — E039 Hypothyroidism, unspecified: Secondary | ICD-10-CM | POA: Diagnosis not present

## 2016-07-23 DIAGNOSIS — I10 Essential (primary) hypertension: Secondary | ICD-10-CM | POA: Diagnosis not present

## 2016-07-23 DIAGNOSIS — E785 Hyperlipidemia, unspecified: Secondary | ICD-10-CM | POA: Diagnosis not present

## 2016-07-23 LAB — CBC WITH DIFFERENTIAL/PLATELET
BASOS PCT: 0.7 % (ref 0.0–3.0)
Basophils Absolute: 0.1 10*3/uL (ref 0.0–0.1)
EOS PCT: 5.3 % — AB (ref 0.0–5.0)
Eosinophils Absolute: 0.4 10*3/uL (ref 0.0–0.7)
HEMATOCRIT: 41.1 % (ref 36.0–46.0)
HEMOGLOBIN: 14.2 g/dL (ref 12.0–15.0)
LYMPHS PCT: 27.9 % (ref 12.0–46.0)
Lymphs Abs: 2.2 10*3/uL (ref 0.7–4.0)
MCHC: 34.5 g/dL (ref 30.0–36.0)
MCV: 90 fl (ref 78.0–100.0)
MONO ABS: 1 10*3/uL (ref 0.1–1.0)
Monocytes Relative: 12.4 % — ABNORMAL HIGH (ref 3.0–12.0)
NEUTROS ABS: 4.2 10*3/uL (ref 1.4–7.7)
Neutrophils Relative %: 53.7 % (ref 43.0–77.0)
PLATELETS: 193 10*3/uL (ref 150.0–400.0)
RBC: 4.57 Mil/uL (ref 3.87–5.11)
RDW: 13.4 % (ref 11.5–15.5)
WBC: 7.8 10*3/uL (ref 4.0–10.5)

## 2016-07-23 LAB — URINALYSIS, ROUTINE W REFLEX MICROSCOPIC
Bilirubin Urine: NEGATIVE
Ketones, ur: NEGATIVE
Leukocytes, UA: NEGATIVE
NITRITE: NEGATIVE
Specific Gravity, Urine: 1.015 (ref 1.000–1.030)
Total Protein, Urine: NEGATIVE
URINE GLUCOSE: NEGATIVE
Urobilinogen, UA: 1 (ref 0.0–1.0)
pH: 6.5 (ref 5.0–8.0)

## 2016-07-23 LAB — LIPID PANEL
CHOLESTEROL: 151 mg/dL (ref 0–200)
HDL: 39.2 mg/dL (ref >39.00)
LDL Cholesterol: 86 mg/dL (ref 0–99)
NonHDL: 112.12
TRIGLYCERIDES: 131 mg/dL (ref 0.0–149.0)
Total CHOL/HDL Ratio: 4
VLDL: 26.2 mg/dL (ref 0.0–40.0)

## 2016-07-23 LAB — HEPATIC FUNCTION PANEL
ALT: 19 U/L (ref 0–35)
AST: 26 U/L (ref 0–37)
Albumin: 4.2 g/dL (ref 3.5–5.2)
Alkaline Phosphatase: 44 U/L (ref 39–117)
Bilirubin, Direct: 0.2 mg/dL (ref 0.0–0.3)
TOTAL PROTEIN: 7.6 g/dL (ref 6.0–8.3)
Total Bilirubin: 0.7 mg/dL (ref 0.2–1.2)

## 2016-07-23 LAB — BASIC METABOLIC PANEL
BUN: 24 mg/dL — ABNORMAL HIGH (ref 6–23)
CO2: 27 mEq/L (ref 19–32)
Calcium: 9.9 mg/dL (ref 8.4–10.5)
Chloride: 104 mEq/L (ref 96–112)
Creatinine, Ser: 1.02 mg/dL (ref 0.40–1.20)
GFR: 56.67 mL/min — AB (ref >60.00)
Glucose, Bld: 114 mg/dL — ABNORMAL HIGH (ref 70–99)
POTASSIUM: 3.8 meq/L (ref 3.5–5.1)
SODIUM: 140 meq/L (ref 135–145)

## 2016-07-23 LAB — HEMOGLOBIN A1C: Hgb A1c MFr Bld: 5.4 % (ref 4.6–6.5)

## 2016-07-23 LAB — TSH: TSH: 1.76 u[IU]/mL (ref 0.35–4.50)

## 2016-07-26 ENCOUNTER — Ambulatory Visit (INDEPENDENT_AMBULATORY_CARE_PROVIDER_SITE_OTHER): Payer: PPO | Admitting: Internal Medicine

## 2016-07-26 ENCOUNTER — Encounter: Payer: Self-pay | Admitting: Internal Medicine

## 2016-07-26 VITALS — BP 124/70 | HR 71 | Temp 98.1°F | Ht 63.5 in | Wt 209.0 lb

## 2016-07-26 DIAGNOSIS — R7303 Prediabetes: Secondary | ICD-10-CM | POA: Diagnosis not present

## 2016-07-26 DIAGNOSIS — Z0000 Encounter for general adult medical examination without abnormal findings: Secondary | ICD-10-CM

## 2016-07-26 DIAGNOSIS — R3129 Other microscopic hematuria: Secondary | ICD-10-CM | POA: Diagnosis not present

## 2016-07-26 MED ORDER — COLCHICINE 0.6 MG PO TABS: 0.6000 mg | ORAL_TABLET | Freq: Every day | ORAL | 3 refills | Status: DC

## 2016-07-26 NOTE — Progress Notes (Signed)
Subjective:   Sheryl Copeland is a 71 y.o. female who presents for Medicare Annual (Subsequent) preventive examination. 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 Hospice has recently become a part of his care which gives a little respite time daily.  Otherwise, patient does not have family or additional support. She does have conversations with her pastor but states the church members are not able to help. She denies feeling hopeless or depressed over the situation. Discussed at length that a Hospice social worker would come to her house and provide counseling.  Review of Systems:  No ROS.  Medicare Wellness Visit.  Cardiac Risk Factors include: hypertension;dyslipidemia;advanced age (>21men, >46 women);obesity (BMI >30kg/m2) Sleep patterns: is not rested upon awakening, and sleeps 3-4 hours nightly.   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 He often is up most of the night. Recommended sleep tips were discussed with patient, suggested that she try to sleep when he was sleeping. Discussed stress reduction techniques with patient.  Home Safety/Smoke Alarms: Feels safe in home. Smoke alarms in place.   Living environment; residence and Firearm Safety: 1-story house/ trailer, no firearms. Seat Belt Safety/Bike Helmet: Wears seat belt.   Counseling:   Eye Exam- goes for yearly exam Dental- dentist last several years, patient states she has a Education officer, community and will make an appointment  Female:   Pap- N/A, Hysterectomy      Mammo- Last 03/01/16,       Dexa scan- Last 02/28/15, normal      CCS- Last 09/01/12, mild diverticulosis, recall 10 years      Objective:     Vitals: BP 124/70   Pulse 71   Temp 98.1 F (36.7 C) (Oral)   Ht 5' 3.5" (1.613 m)   Wt 209 lb (94.8 kg)   SpO2 98%   BMI 36.44 kg/m   Body mass index is 36.44 kg/m.   Tobacco History    Smoking Status  . Never Smoker  Smokeless Tobacco  . Never Used     Counseling given: Not Answered   Past Medical History:  Diagnosis Date  . Acute meniscal tear of knee sept 2012   right knee  . Allergy   . Arthritis of foot, right    first mtp  . Asthma   . Blood in stool   . Cervical disc disease    s/p cervical spine surgury 1990  . Chronic low back pain 07/15/2011  . 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 Hyperlipidemia 07/15/2011  . Hypertension   . Hypothyroidism   . IBS (irritable bowel syndrome) 07/03/2011  . Impaired glucose tolerance 07/03/2011  . Insomnia 07/15/2011  . Lumbar disc disease    s/p lumbar fusion 1999  . Migraine   . Peripheral neuropathy (HCC) 07/03/2011  . Restrictive lung disease 07/15/2011   Past Surgical History:  Procedure Laterality Date  . ABDOMINAL HYSTERECTOMY  1974  . APPENDECTOMY  1962  . CARPAL TUNNEL RELEASE     x 2  . CERVICAL LAMINECTOMY    . CHOLECYSTECTOMY  1967  . HEMORRHOID SURGERY    . LUMBAR FUSION    . TONSILLECTOMY  1967   Family History  Problem Relation Age of Onset  . Alcohol abuse Other   . Cancer Other     breast cancer  . Stroke Other   . Hypertension Other   . Mental illness Other   .  Arthritis Other   . Alcohol abuse Other   . Arthritis Other   . Cancer Other     prostate cancer and breast cancer  . Hyperlipidemia Other   . Heart disease Other   . Hypertension Other   . Diabetes Other   . Colon cancer Neg Hx    History  Sexual Activity  . Sexual activity: No    Outpatient Encounter Prescriptions as of 07/26/2016  Medication Sig  . albuterol (PROVENTIL HFA;VENTOLIN HFA) 108 (90 Base) MCG/ACT inhaler Inhale 2 puffs into the lungs every 6 (six) hours as needed for wheezing or shortness of breath.  Marland Kitchen. atenolol (TENORMIN) 50 MG tablet Take 1 tablet by mouth  daily  . BIOTIN PO Take 2 tablets by mouth daily.   . Cholecalciferol (VITAMIN D3) 1000 units CAPS Take 2 capsules (2,000 Units total) by  mouth daily.  . colchicine 0.6 MG tablet Take 1 tablet (0.6 mg total) by mouth daily.  Marland Kitchen. EPIPEN 2-PAK 0.3 MG/0.3ML SOAJ injection   . hydrochlorothiazide (MICROZIDE) 12.5 MG capsule Take 2 capsules (25 mg total) by mouth daily.  . nabumetone (RELAFEN) 500 MG tablet Take 1 tablet (500 mg total) by mouth daily.  . potassium chloride (MICRO-K) 10 MEQ CR capsule Take 1 capsule (10 mEq total) by mouth daily.  . silver sulfADIAZINE (SILVADENE) 1 % cream Apply 1 application topically daily.  Marland Kitchen. thyroid (ARMOUR THYROID) 120 MG tablet Take 1 tablet (120 mg total) by mouth daily before breakfast.  . traMADol (ULTRAM) 50 MG tablet Take 1 tablet (50 mg total) by mouth every 8 (eight) hours as needed.  . [DISCONTINUED] colchicine 0.6 MG tablet Take 0.6 mg by mouth daily as needed.   No facility-administered encounter medications on file as of 07/26/2016.     Activities of Daily Living In your present state of health, do you have any difficulty performing the following activities: 07/26/2016  Hearing? N  Vision? N  Difficulty concentrating or making decisions? N  Walking or climbing stairs? N  Dressing or bathing? N  Doing errands, shopping? N  Preparing Food and eating ? N  Using the Toilet? N  In the past six months, have you accidently leaked urine? N  Do you have problems with loss of bowel control? N  Managing your Medications? N  Managing your Finances? N  Housekeeping or managing your Housekeeping? N  Some recent data might be hidden    Patient Care Team: Corwin LevinsJames W John, MD as PCP - General (Internal Medicine) Pincus SanesStacy J Burns, MD as Consulting Physician (Internal Medicine)    Assessment:    Physical assessment deferred to PCP.  Exercise Activities and Dietary recommendations Current Exercise Habits: The patient has a physically strenous job, but has no regular exercise apart from work. (yard work, takes care of husband full-time), Exercise limited by: orthopedic condition(s);Other - see  comments (foot injury)  Diet (meal preparation, eat out, water intake, caffeinated beverages, dairy products, fruits and vegetables): Reports that she eats a variety of vegetables and fruits. The diet varies at times it is healthier and sometimes it is unhealthy. States that she mainly drinks water and limits sugar drinks.    Discussed low salt, low fat, low sugar diet, portion control, healthy snack choices, and maintaining her current water intake.   Goals    . <enter goal here>    . try to rest as much as possible and stay strong for my husband      Fall Risk Fall Risk  07/26/2016 07/26/2016 06/30/2015 12/28/2014 11/17/2013  Falls in the past year? No No No No No   Depression Screen PHQ 2/9 Scores 07/26/2016 07/26/2016 06/30/2015 12/28/2014  PHQ - 2 Score 0 0 0 0     Cognitive Function MMSE - Mini Mental State Exam 12/28/2014  Not completed: Unable to complete       Ad8 score reviewed for issues:  Issues making decisions: no  Less interest in hobbies / activities: no  Repeats questions, stories (family complaining): no  Trouble using ordinary gadgets (microwave, computer, phone): no  Forgets the month or year: no  Mismanaging finances: no  Remembering appts: no  Daily problems with thinking and/or memory: no Ad8 score is= 0     Immunization History  Administered Date(s) Administered  . Pneumococcal Conjugate-13 11/17/2013  . Pneumococcal Polysaccharide-23 07/03/2012  . Tdap 05/19/1999  . Zoster 08/22/2007   Screening Tests Health Maintenance  Topic Date Due  . INFLUENZA VACCINE  08/04/2016 (Originally 12/06/2015)  . MAMMOGRAM  03/01/2018  . TETANUS/TDAP  05/07/2020  . COLONOSCOPY  09/02/2022  . DEXA SCAN  Completed  . Hepatitis C Screening  Completed  . PNA vac Low Risk Adult  Completed      Plan:     Start to eat heart healthy diet (full of fruits, vegetables, whole grains, lean protein, water--limit salt, fat, and sugar intake) and increase physical  activity as tolerated.  Patient will talk to hospice nurse about respite options and Hospice social worker to help with resources and counseling.  Nurse Health Coach will search for community resources to give additional support and help. Will investigate what grocery stores may  deliver to her address and price the added on cost for on-line grocery shopping and grocery delivery options.   During the course of the visit the patient was educated and counseled about the following appropriate screening and preventive services:   Vaccines to include Pneumoccal, Influenza, Hepatitis B, Td, Zostavax, HCV  Cardiovascular Disease  Colorectal cancer screening  Bone density screening  Diabetes screening  Glaucoma screening  Mammography/PAP  Nutrition counseling   Patient Instructions (the written plan) was given to the patient.   Wanda Plump, RN  07/27/2016   Medical screening examination/treatment/procedure(s) were performed by non-physician practitioner and as supervising physician I was immediately available for consultation/collaboration. I agree with above. Oliver Barre, MD

## 2016-07-26 NOTE — Progress Notes (Signed)
Pre visit review using our clinic review tool, if applicable. No additional management support is needed unless otherwise documented below in the visit note. 

## 2016-07-26 NOTE — Progress Notes (Signed)
Subjective:    Patient ID: Sheryl Copeland, female    DOB: 06/24/1945, 71 y.o.   MRN: [id number]  HPI  Here for wellness and f/u;  Overall doing ok;  Pt denies Chest pain, worsening SOB, DOE, wheezing, orthopnea, PND, worsening LE edema, palpitations, dizziness or syncope.  Pt denies neurological change such as new headache, facial or extremity weakness.  Pt denies polydipsia, polyuria, or low sugar symptoms. Pt states overall good compliance with treatment and medications, good tolerability, and has been trying to follow appropriate diet.  Pt denies worsening depressive symptoms, suicidal ideation or panic. No fever, night sweats, wt loss, loss of appetite, or other constitutional symptoms.  Pt states good ability with ADL's, has low fall risk, home safety reviewed and adequate, no other significant changes in hearing or vision, and not active with exercise. 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 Acute meniscal tear of knee sept 2012   right knee  . Allergy   . Arthritis of foot, right    first mtp  . Asthma   . Blood in stool   . Cervical disc disease    s/p cervical spine surgury 1990  . Chronic low back pain 07/15/2011  . 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 Hyperlipidemia 07/15/2011  . Hypertension   . Hypothyroidism   . IBS (irritable bowel syndrome) 07/03/2011  . Impaired glucose tolerance 07/03/2011  . Insomnia 07/15/2011  . Lumbar disc disease    s/p lumbar fusion 1999  . Migraine   . Peripheral neuropathy (HCC) 07/03/2011  . Restrictive lung disease 07/15/2011   Past Surgical History:  Procedure Laterality Date  . ABDOMINAL HYSTERECTOMY  1974  . APPENDECTOMY  1962  . CARPAL TUNNEL RELEASE     x 2  . CERVICAL LAMINECTOMY    . CHOLECYSTECTOMY  1967  . HEMORRHOID SURGERY    . LUMBAR FUSION    . TONSILLECTOMY  1967    reports that she has never smoked. She has never used  smokeless tobacco. She reports that she does not drink alcohol or use drugs. family history includes Alcohol abuse in her other and other; Arthritis in her other and other; Cancer in her other and other; Diabetes in her other; Heart disease in her other; Hyperlipidemia in her other; Hypertension in her other and other; Mental illness in her other; Stroke in her other. Allergies  Allergen Reactions  . Aspirin Swelling    Throat closes  . Celebrex [Celecoxib] Hives  . Dipyridamole     Per pt: "opposite effect"  . Eggs Or Egg-Derived Products     diarrhea  . Erythromycin Hives  . Gabapentin     Per pt: uknown  . Iodine Swelling    Throat closes  . Penicillins Diarrhea   Current Outpatient Prescriptions on File Prior to Visit  Medication Sig Dispense Refill  . albuterol (PROVENTIL HFA;VENTOLIN HFA) 108 (90 Base) MCG/ACT inhaler Inhale 2 puffs into the lungs every 6 (six) hours as needed for wheezing or shortness of breath. 1 Inhaler 2  . atenolol (TENORMIN) 50 MG tablet Take 1 tablet by mouth  daily 90 tablet 1  . BIOTIN PO Take 2 tablets by mouth daily.     . Cholecalciferol (VITAMIN D3) 1000 units CAPS Take 2 capsules (2,000 Units total) by mouth daily. 60 capsule 0  . EPIPEN 2-PAK 0.3 MG/0.3ML SOAJ injection     .  hydrochlorothiazide (MICROZIDE) 12.5 MG capsule Take 2 capsules (25 mg total) by mouth daily. 180 capsule 1  . nabumetone (RELAFEN) 500 MG tablet Take 1 tablet (500 mg total) by mouth daily. 90 tablet 1  . potassium chloride (MICRO-K) 10 MEQ CR capsule Take 1 capsule (10 mEq total) by mouth daily. 90 capsule 1  . silver sulfADIAZINE (SILVADENE) 1 % cream Apply 1 application topically daily. 50 g 0  . thyroid (ARMOUR THYROID) 120 MG tablet Take 1 tablet (120 mg total) by mouth daily before breakfast. 90 tablet 1  . traMADol (ULTRAM) 50 MG tablet Take 1 tablet (50 mg total) by mouth every 8 (eight) hours as needed. 270 tablet 1   No current facility-administered medications on  file prior to visit.    Review of Systems Constitutional: Negative for increased diaphoresis, or other activity, appetite or siginficant weight change other than noted HENT: Negative for worsening hearing loss, ear pain, facial swelling, mouth sores and neck stiffness.   Eyes: Negative for other worsening pain, redness or visual disturbance.  Respiratory: Negative for choking or stridor Cardiovascular: Negative for other chest pain and palpitations.  Gastrointestinal: Negative for worsening diarrhea, blood in stool, or abdominal distention Genitourinary: Negative for hematuria, flank pain or change in urine volume.  Musculoskeletal: Negative for myalgias or other joint complaints.  Skin: Negative for other color change and wound or drainage.  Neurological: Negative for syncope and numbness. other than noted Hematological: Negative for adenopathy. or other swelling Psychiatric/Behavioral: Negative for hallucinations, SI, self-injury, decreased concentration or other worsening agitation.  All other system neg per pt    Objective:   Physical Exam BP 124/70   Pulse 71   Temp 98.1 F (36.7 C) (Oral)   Ht 5' 3.5" (1.613 m)   Wt 209 lb (94.8 kg)   SpO2 98%   BMI 36.44 kg/m  VS noted,  Constitutional: Pt is oriented to person, place, and time. Appears well-developed and well-nourished, in no significant distress Head: Normocephalic and atraumatic  Eyes: Conjunctivae and EOM are normal. Pupils are equal, round, and reactive to light Right Ear: External ear normal.  Left Ear: External ear normal Nose: Nose normal.  Mouth/Throat: Oropharynx is clear and moist  Neck: Normal range of motion. Neck supple. No JVD present. No tracheal deviation present or significant neck LA or mass Cardiovascular: Normal rate, regular rhythm, normal heart sounds and intact distal pulses.   Pulmonary/Chest: Effort normal and breath sounds without rales or wheezing  Abdominal: Soft. Bowel sounds are normal. NT.  No HSM  Musculoskeletal: Normal range of motion. Exhibits no edema Lymphadenopathy: Has no cervical adenopathy.  Neurological: Pt is alert and oriented to person, place, and time. Pt has normal reflexes. No cranial nerve deficit. Motor grossly intact Skin: Skin is warm and dry. No rash noted or new ulcers Psychiatric:  Has normal mood and affect. Behavior is normal.  No other exam findings  ECG today I have personally interpreted Sinus  Rhythm  Low voltage in precordial leads.      Assessment & Plan:

## 2016-07-26 NOTE — Patient Instructions (Addendum)
Please continue all other medications as before, and refills have been done if requested - the colchicine  Please have the pharmacy call with any other refills you may need.  Please continue your efforts at being more active, low cholesterol diet, and weight control.  You are otherwise up to date with prevention measures today.  Please keep your appointments with your specialists as you may have planned  Please return in 1 year for your yearly visit, or sooner if needed, with Lab testing done 3-5 days before  Talk to Hospice nurse about respite options and social worker to help with resources and counseling   Sheryl Copeland , Thank you for taking time to come for your Medicare Wellness Visit. I appreciate your ongoing commitment to your health goals. Please review the following plan we discussed and let me know if I can assist you in the future.   These are the goals we discussed: Goals    . <enter goal here>    . try rest as much as possible and stay strong for my husband       This is a list of the screening recommended for you and due dates:  Health Maintenance  Topic Date Due  . Flu Shot  08/04/2016*  . Mammogram  03/01/2018  . Tetanus Vaccine  05/07/2020  . Colon Cancer Screening  09/02/2022  . DEXA scan (bone density measurement)  Completed  .  Hepatitis C: One time screening is recommended by Center for Disease Control  (CDC) for  adults born from [birth year] through 1965.   Completed  . Pneumonia vaccines  Completed  *Topic was postponed. The date shown is not the original due date.

## 2016-07-26 NOTE — Assessment & Plan Note (Signed)
D/w pt, declines CT eval and/or urology referral for now

## 2016-07-28 NOTE — Assessment & Plan Note (Signed)

## 2016-07-28 NOTE — Assessment & Plan Note (Signed)
Mild to mod, for diet and wt control,  to f/u any worsening symptoms or concerns

## 2016-07-30 ENCOUNTER — Telehealth: Payer: Self-pay | Admitting: *Deleted

## 2016-07-30 NOTE — Telephone Encounter (Signed)
Called patient to discuss community resources that were located to assist the patient. Patient did not answer, left a VM requesting patient to call back.

## 2016-07-31 NOTE — Telephone Encounter (Signed)
Patient called nurse back as requested. Nurse gave patient resource numbers for [employer], [facility], and discussed that [employer] does not charge for grocery pick-up if she placed her grocery order online. Discussed [facility] as a referral that could be beneficial to the patient. Patient stated that she would like the nurse to [facility]. Patient stated she will contact nurse for any further needs in the future.

## 2016-08-06 ENCOUNTER — Other Ambulatory Visit: Payer: Self-pay | Admitting: *Deleted

## 2016-08-06 NOTE — Patient Outreach (Signed)
[facility]) Care Management  08/06/2016  Tamberly Pomplun Sowers-Face 27-Nov-1945 [id number]  CSW rec'd referral to assist patient support and services to help her in caring for her husband.  CSW spoke with patient who reports her husband is at home and under Hospice care. "They gave me a 5 night respite stay" which helped her to get things done but admits to needing more help.  She also shared with CSW that her back makes caregiving hard for her.  CSW discussed possible options for hiring help in the home and will send her resources for this but she reports funds are limited. CSW offered to seek some area community resources that may be of help and plan to mail resources, contact and follow up with her.  CSW encouraged her to discuss with Hospice if additional help is available as well to ask about a hoyer lift to aide in her ability to care for him alone. She plans to ask the hospice team.    CSW offered support and validated her feelings as the sole caregiver and the difficulties faced with his dementia. She was appreciative and agreeable to a follow up call for possible home visit/assessment.     Reece Levy, MSW, LCSW Clinical Social Worker  [facility] [phone]

## 2016-08-07 NOTE — Patient Outreach (Signed)
[facility]) Care Management  08/07/2016  Sheryl Copeland 02-13-1946 [id number]   Community Resources mailed out to patient, per Reece Levy

## 2016-08-27 ENCOUNTER — Ambulatory Visit: Payer: Self-pay | Admitting: *Deleted

## 2016-08-29 ENCOUNTER — Ambulatory Visit: Payer: Self-pay | Admitting: *Deleted

## 2016-08-29 ENCOUNTER — Other Ambulatory Visit: Payer: Self-pay | Admitting: *Deleted

## 2016-08-29 NOTE — Patient Outreach (Signed)
[facility]) Care Management  08/29/2016  Tannah Dreyfuss Sowers-Davern [birth year] [id number]   CSW has attempted to reach patient by phone twice this week; leaving voice mail message to return call.  CSW will await callback or try again in the next week. CSW is attempting to determine if patient agrees to [facility] CM program and to plan home visit if desired.   Reece Levy, MSW, LCSW Clinical Social Worker  [facility] [employer] [phone]

## 2016-08-31 ENCOUNTER — Ambulatory Visit: Payer: Self-pay | Admitting: *Deleted

## 2016-09-28 ENCOUNTER — Other Ambulatory Visit: Payer: Self-pay | Admitting: *Deleted

## 2016-09-28 NOTE — Patient Outreach (Signed)
[facility]) Care Management  09/28/2016  Tollie EthSheryl D Sowers-Martone [birth year] [id number]   CSW contacted patient who reports her husband is under hospice care; "they are helping 5 days a week and respite once a month".  Patient declines [facility] CSW involvement and support; stating she feels they are set up with hospice and no further needs.  CSW will close CSW referral and advise [facility] team and PCP.      Reece LevyJanet Yunuen Mordan, MSW, LCSW Clinical Social Worker  [facility] [phone]

## 2016-11-09 ENCOUNTER — Other Ambulatory Visit: Payer: Self-pay | Admitting: Internal Medicine

## 2016-12-08 ENCOUNTER — Other Ambulatory Visit: Payer: Self-pay | Admitting: Internal Medicine

## 2016-12-11 ENCOUNTER — Other Ambulatory Visit: Payer: Self-pay | Admitting: Internal Medicine

## 2017-01-24 ENCOUNTER — Telehealth: Payer: Self-pay | Admitting: Internal Medicine

## 2017-01-24 NOTE — Telephone Encounter (Signed)
Called pt to schedule AWV. Lvm for Pt to call office to schedule appt. Last AWV 12/28/2014. Pt can schedule appt at anytime with healthcoach nurse.

## 2017-02-12 ENCOUNTER — Other Ambulatory Visit: Payer: Self-pay | Admitting: Internal Medicine

## 2017-02-12 DIAGNOSIS — Z1231 Encounter for screening mammogram for malignant neoplasm of breast: Secondary | ICD-10-CM

## 2017-02-15 ENCOUNTER — Other Ambulatory Visit: Payer: Self-pay | Admitting: Internal Medicine

## 2017-03-04 ENCOUNTER — Ambulatory Visit
Admission: RE | Admit: 2017-03-04 | Discharge: 2017-03-04 | Disposition: A | Discharge status: Home / Self Care | Payer: PPO | Source: Ambulatory Visit | Attending: Internal Medicine | Admitting: Internal Medicine

## 2017-03-04 DIAGNOSIS — Z1231 Encounter for screening mammogram for malignant neoplasm of breast: Secondary | ICD-10-CM

## 2017-03-31 DIAGNOSIS — N3090 Cystitis, unspecified without hematuria: Secondary | ICD-10-CM | POA: Diagnosis not present

## 2017-05-15 ENCOUNTER — Other Ambulatory Visit: Payer: Self-pay | Admitting: Internal Medicine

## 2017-05-22 ENCOUNTER — Telehealth: Payer: Self-pay

## 2017-05-22 ENCOUNTER — Other Ambulatory Visit (INDEPENDENT_AMBULATORY_CARE_PROVIDER_SITE_OTHER): Payer: PPO

## 2017-05-22 ENCOUNTER — Encounter: Payer: Self-pay | Admitting: Internal Medicine

## 2017-05-22 ENCOUNTER — Ambulatory Visit (INDEPENDENT_AMBULATORY_CARE_PROVIDER_SITE_OTHER): Payer: PPO | Admitting: Internal Medicine

## 2017-05-22 VITALS — BP 144/100 | HR 69 | Temp 97.7°F | Ht 63.5 in | Wt 201.0 lb

## 2017-05-22 DIAGNOSIS — R7303 Prediabetes: Secondary | ICD-10-CM

## 2017-05-22 DIAGNOSIS — R350 Frequency of micturition: Secondary | ICD-10-CM | POA: Diagnosis not present

## 2017-05-22 DIAGNOSIS — J329 Chronic sinusitis, unspecified: Secondary | ICD-10-CM

## 2017-05-22 DIAGNOSIS — I10 Essential (primary) hypertension: Secondary | ICD-10-CM | POA: Diagnosis not present

## 2017-05-22 LAB — URINALYSIS, ROUTINE W REFLEX MICROSCOPIC
Bilirubin Urine: NEGATIVE
Ketones, ur: NEGATIVE
Nitrite: NEGATIVE
PH: 6.5 (ref 5.0–8.0)
SPECIFIC GRAVITY, URINE: 1.01 (ref 1.000–1.030)
TOTAL PROTEIN, URINE-UPE24: 30 — AB
UROBILINOGEN UA: 1 (ref 0.0–1.0)
Urine Glucose: NEGATIVE

## 2017-05-22 MED ORDER — LEVOFLOXACIN 250 MG PO TABS: 250.0000 mg | ORAL_TABLET | Freq: Every day | ORAL | 0 refills | Status: AC

## 2017-05-22 NOTE — Telephone Encounter (Signed)
-----   Message from Corwin LevinsJames W John, MD sent at 05/22/2017  1:49 PM EST ----- Pt states to me she no longer has Mychart access  OK to let pt know the urine testing does suggest an infection, but will 2-3 days for the culture to result.  OK to continue the antibx she was given today.

## 2017-05-22 NOTE — Patient Instructions (Addendum)
Please take all new medication as prescribed - the antibiotic  Please continue all other medications as before, and refills have been done if requested.  Please have the pharmacy call with any other refills you may need.  Please continue your efforts at being more active, low cholesterol diet, and weight control.  Please keep your appointments with your specialists as you may have planned  Please go to the LAB in the Basement (turn left off the elevator) for the tests to be done today  You will be contacted by phone if any changes need to be made immediately.  Otherwise, you will receive a letter about your results with an explanation, but please check with MyChart first.  Please remember to sign up for MyChart if you have not done so, as this will be important to you in the future with finding out test results, communicating by private email, and scheduling acute appointments online when needed.  Please return in 3 months, or sooner if needed, with Lab testing done 3-5 days before  

## 2017-05-22 NOTE — Assessment & Plan Note (Signed)
I encouraged OTC allegra and nasacort asd,  to f/u any worsening symptoms or concerns

## 2017-05-22 NOTE — Assessment & Plan Note (Signed)
C/w possible UTI, ok for empiric antbx, check urine studies, further eval and tx pending clinical and lab results

## 2017-05-22 NOTE — Progress Notes (Signed)
Subjective:    Patient ID: Sheryl Copeland, female    DOB: 03-Jun-1945, 72 y.o.   MRN: [id number]  HPI  Here with 3-4 days onset urinary freq with urgency, small amounts, cloudy, bad small but Denies urinary symptoms such as dysuria, flank pain, hematuria or n/v, fever, chills.  Pt denies chest pain, increasing sob or doe, wheezing, orthopnea, PND, increased LE swelling, palpitations, dizziness or syncope.  Pt denies new neurological symptoms such as new headache, or facial or extremity weakness or numbness.  Pt denies polydipsia, polyuria. Does also have chronic sinus congestion and mild discomfort without fever x 5 mo Past Medical History:  Diagnosis Date  . Acute meniscal tear of knee sept 2012   right knee  . Allergy   . Arthritis of foot, right    first mtp  . Asthma   . Blood in stool   . Cervical disc disease    s/p cervical spine surgury 1990  . Chronic low back pain 07/15/2011  . 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 Hyperlipidemia 07/15/2011  . Hypertension   . Hypothyroidism   . IBS (irritable bowel syndrome) 07/03/2011  . Impaired glucose tolerance 07/03/2011  . Insomnia 07/15/2011  . Lumbar disc disease    s/p lumbar fusion 1999  . Migraine   . Peripheral neuropathy 07/03/2011  . Restrictive lung disease 07/15/2011   Past Surgical History:  Procedure Laterality Date  . ABDOMINAL HYSTERECTOMY  1974  . APPENDECTOMY  1962  . BREAST BIOPSY Left 2014  . CARPAL TUNNEL RELEASE     x 2  . CERVICAL LAMINECTOMY    . CHOLECYSTECTOMY  1967  . HEMORRHOID SURGERY    . LUMBAR FUSION    . TONSILLECTOMY  1967    reports that  has never smoked. she has never used smokeless tobacco. She reports that she does not drink alcohol or use drugs. family history includes Alcohol abuse in her other and other; Arthritis in her other and other; Breast cancer in her mother; Cancer in her other and other; Diabetes in her other; Heart disease in her other; Hyperlipidemia in her  other; Hypertension in her other and other; Mental illness in her other; Stroke in her other. Allergies  Allergen Reactions  . Aspirin Swelling    Throat closes  . Celebrex [Celecoxib] Hives  . Dipyridamole     Per pt: "opposite effect"  . Eggs Or Egg-Derived Products     diarrhea  . Erythromycin Hives  . Gabapentin     Per pt: uknown  . Iodine Swelling    Throat closes  . Penicillins Diarrhea   Current Outpatient Medications on File Prior to Visit  Medication Sig Dispense Refill  . albuterol (PROVENTIL HFA;VENTOLIN HFA) 108 (90 Base) MCG/ACT inhaler Inhale 2 puffs into the lungs every 6 (six) hours as needed for wheezing or shortness of breath. 1 Inhaler 2  . ARMOUR THYROID 120 MG tablet TAKE 1 TABLET BY MOUTH DAILY BEFORE BREAKFAST 90 tablet 1  . atenolol (TENORMIN) 50 MG tablet TAKE 1 TABLET BY MOUTH ONCE DAILY 90 tablet 0  . BIOTIN PO Take 2 tablets by mouth daily.     . Cholecalciferol (VITAMIN D3) 1000 units CAPS Take 2 capsules (2,000 Units total) by mouth daily. 60 capsule 0  . colchicine 0.6 MG tablet Take 1 tablet (0.6 mg total) by mouth daily. 90 tablet 3  . EPIPEN 2-PAK 0.3 MG/0.3ML SOAJ injection     . hydrochlorothiazide (MICROZIDE) 12.5 MG  capsule Take 2 capsules (25 mg total) by mouth daily. 180 capsule 1  . nabumetone (RELAFEN) 500 MG tablet TAKE 1 TABLET BY MOUTH ONCE DAILY 90 tablet 0  . nabumetone (RELAFEN) 500 MG tablet TAKE 1 TABLET BY MOUTH ONCE DAILY 90 tablet 1  . potassium chloride (MICRO-K) 10 MEQ CR capsule TAKE 1 CAPSULE BY MOUTH ONCE DAILY 90 capsule 2  . silver sulfADIAZINE (SILVADENE) 1 % cream Apply 1 application topically daily. 50 g 0  . traMADol (ULTRAM) 50 MG tablet Take 1 tablet (50 mg total) by mouth every 8 (eight) hours as needed. 270 tablet 1   No current facility-administered medications on file prior to visit.    Review of Systems  Constitutional: Negative for other unusual diaphoresis or sweats HENT: Negative for ear discharge or  swelling Eyes: Negative for other worsening visual disturbances Respiratory: Negative for stridor or other swelling  Gastrointestinal: Negative for worsening distension or other blood Genitourinary: Negative for retention or other urinary change Musculoskeletal: Negative for other MSK pain or swelling Skin: Negative for color change or other new lesions Neurological: Negative for worsening tremors and other numbness  Psychiatric/Behavioral: Negative for worsening agitation or other fatigue All other system neg per pt    Objective:   Physical Exam BP (!) 144/100   Pulse 69   Temp 97.7 F (36.5 C) (Oral)   Ht 5' 3.5" (1.613 m)   Wt 201 lb (91.2 kg)   SpO2 97%   BMI 35.05 kg/m  VS noted, mild ill Constitutional: Pt appears in NAD HENT: Head: NCAT.  Right Ear: External ear normal.  Left Ear: External ear normal.  Eyes: . Pupils are equal, round, and reactive to light. Conjunctivae and EOM are normal Bilat tm's with mild erythema.  Max sinus areas non tender.  Pharynx with mild erythema, no exudate Nose: without d/c or deformity Neck: Neck supple. Gross normal ROM Cardiovascular: Normal rate and regular rhythm.   Pulmonary/Chest: Effort normal and breath sounds without rales or wheezing.  Abd:  Soft, ND, + BS, no organomegaly, with low mid abd tender without guarding or rebound, no flank tender Neurological: Pt is alert. At baseline orientation, motor grossly intact Skin: Skin is warm. No rashes, other new lesions, no LE edema Psychiatric: Pt behavior is normal without agitation , mild nervous No other exam findings  Lab Results  Component Value Date   WBC 7.8 07/23/2016   HGB 14.2 07/23/2016   HCT 41.1 07/23/2016   PLT 193.0 07/23/2016   GLUCOSE 114 (H) 07/23/2016   CHOL 151 07/23/2016   TRIG 131.0 07/23/2016   HDL 39.20 07/23/2016   LDLCALC 86 07/23/2016   ALT 19 07/23/2016   AST 26 07/23/2016   NA 140 07/23/2016   K 3.8 07/23/2016   CL 104 07/23/2016   CREATININE  1.02 07/23/2016   BUN 24 (H) 07/23/2016   CO2 27 07/23/2016   TSH 1.76 07/23/2016   HGBA1C 5.4 07/23/2016       Assessment & Plan:

## 2017-05-22 NOTE — Assessment & Plan Note (Signed)
Mild elev today, likely situational, o/w stable overall by history and exam, recent data reviewed with pt, and pt to continue medical treatment as before,  to f/u any worsening symptoms or concerns BP Readings from Last 3 Encounters:  05/22/17 (!) 144/100  07/26/16 124/70  05/25/16 138/78

## 2017-05-22 NOTE — Telephone Encounter (Signed)
Called pt, LVM.   CRM created.  

## 2017-05-22 NOTE — Addendum Note (Signed)
Addended by: Corwin LevinsJOHN, Morrie Daywalt W on: 05/22/2017 01:05 PM   Modules accepted: Orders

## 2017-05-22 NOTE — Assessment & Plan Note (Signed)
Lab Results  Component Value Date   HGBA1C 5.4 07/23/2016  stable overall by history and exam, recent data reviewed with pt, and pt to continue medical treatment as before,  to f/u any worsening symptoms or concerns

## 2017-05-23 LAB — URINE CULTURE
MICRO NUMBER: 90066247
SPECIMEN QUALITY: ADEQUATE

## 2017-05-29 ENCOUNTER — Telehealth: Payer: Self-pay | Admitting: Internal Medicine

## 2017-05-29 NOTE — Telephone Encounter (Signed)
Copied from CRM [id number]. Topic: Quick Communication - See Telephone Encounter >> May 29, 2017  2:39 PM Rudi CocoLathan, Bexton Haak M, NT wrote: CRM for notification. See Telephone encounter for:   05/29/17. Pt. Calling asking about results from urine culture. Pt. Can be reached at [phone]

## 2017-05-29 NOTE — Telephone Encounter (Signed)
See lab result for negative urine cx

## 2017-05-29 NOTE — Telephone Encounter (Signed)
Informed pt that results have not been back yet. She stated that the antibiotic that was prescribed is not working on her UTI. Please advise.

## 2017-05-30 NOTE — Telephone Encounter (Signed)
Pt has been informed and expressed understanding.  

## 2017-06-06 ENCOUNTER — Telehealth: Payer: Self-pay

## 2017-06-06 DIAGNOSIS — R350 Frequency of micturition: Secondary | ICD-10-CM

## 2017-06-06 NOTE — Addendum Note (Signed)
Addended by: Corwin LevinsJOHN, Ariatna Jester W on: 06/06/2017 12:37 PM   Modules accepted: Orders

## 2017-06-06 NOTE — Telephone Encounter (Signed)
Dr. Jonny RuizJohn I do not see that a referral to urology was entered. Please advise.   Copied from CRM [phone]. Topic: Referral - Status >> Jun 05, 2017  3:43 PM Landry MellowFoltz, Melissa J wrote: Reason for CRM: pt called - she was told that she would have a referral for urology. She is calling to check up.  Please call back at [phone]

## 2017-06-06 NOTE — Telephone Encounter (Addendum)
Done per emr 

## 2017-06-13 ENCOUNTER — Other Ambulatory Visit: Payer: Self-pay | Admitting: Internal Medicine

## 2017-06-14 DIAGNOSIS — R350 Frequency of micturition: Secondary | ICD-10-CM | POA: Diagnosis not present

## 2017-08-09 ENCOUNTER — Other Ambulatory Visit: Payer: Self-pay | Admitting: Internal Medicine

## 2017-08-12 DIAGNOSIS — H52223 Regular astigmatism, bilateral: Secondary | ICD-10-CM | POA: Diagnosis not present

## 2017-08-12 DIAGNOSIS — H2513 Age-related nuclear cataract, bilateral: Secondary | ICD-10-CM | POA: Diagnosis not present

## 2017-08-13 ENCOUNTER — Other Ambulatory Visit: Payer: Self-pay | Admitting: Internal Medicine

## 2017-09-04 ENCOUNTER — Other Ambulatory Visit: Payer: Self-pay | Admitting: Internal Medicine

## 2017-12-03 ENCOUNTER — Other Ambulatory Visit: Payer: Self-pay | Admitting: Internal Medicine

## 2018-01-31 ENCOUNTER — Other Ambulatory Visit: Payer: Self-pay | Admitting: Internal Medicine

## 2018-01-31 DIAGNOSIS — Z1231 Encounter for screening mammogram for malignant neoplasm of breast: Secondary | ICD-10-CM

## 2018-03-05 ENCOUNTER — Other Ambulatory Visit: Payer: Self-pay | Admitting: Internal Medicine

## 2018-03-05 ENCOUNTER — Ambulatory Visit
Admission: RE | Admit: 2018-03-05 | Discharge: 2018-03-05 | Disposition: A | Discharge status: Home / Self Care | Payer: PPO | Source: Ambulatory Visit | Attending: Internal Medicine | Admitting: Internal Medicine

## 2018-03-05 DIAGNOSIS — Z1231 Encounter for screening mammogram for malignant neoplasm of breast: Secondary | ICD-10-CM

## 2018-05-23 ENCOUNTER — Telehealth: Payer: Self-pay | Admitting: *Deleted

## 2018-05-23 NOTE — Telephone Encounter (Signed)
LVM for patient to call back in regards to scheduling AWV with our health coach.  

## 2018-05-26 ENCOUNTER — Other Ambulatory Visit (INDEPENDENT_AMBULATORY_CARE_PROVIDER_SITE_OTHER): Payer: Medicare HMO

## 2018-05-26 ENCOUNTER — Encounter: Payer: Self-pay | Admitting: Internal Medicine

## 2018-05-26 ENCOUNTER — Ambulatory Visit (INDEPENDENT_AMBULATORY_CARE_PROVIDER_SITE_OTHER): Payer: Medicare HMO | Admitting: Internal Medicine

## 2018-05-26 ENCOUNTER — Telehealth: Payer: Self-pay | Admitting: Internal Medicine

## 2018-05-26 VITALS — BP 144/92 | HR 63 | Temp 98.2°F | Ht 63.5 in | Wt 205.0 lb

## 2018-05-26 DIAGNOSIS — R7303 Prediabetes: Secondary | ICD-10-CM | POA: Diagnosis not present

## 2018-05-26 DIAGNOSIS — Z0001 Encounter for general adult medical examination with abnormal findings: Secondary | ICD-10-CM

## 2018-05-26 DIAGNOSIS — M545 Low back pain: Secondary | ICD-10-CM

## 2018-05-26 DIAGNOSIS — G8929 Other chronic pain: Secondary | ICD-10-CM

## 2018-05-26 DIAGNOSIS — K589 Irritable bowel syndrome without diarrhea: Secondary | ICD-10-CM

## 2018-05-26 DIAGNOSIS — I10 Essential (primary) hypertension: Secondary | ICD-10-CM | POA: Diagnosis not present

## 2018-05-26 LAB — URINALYSIS, ROUTINE W REFLEX MICROSCOPIC
Bilirubin Urine: NEGATIVE
Ketones, ur: NEGATIVE
Nitrite: NEGATIVE
Specific Gravity, Urine: 1.02 (ref 1.000–1.030)
Total Protein, Urine: 30 — AB
Urine Glucose: NEGATIVE
Urobilinogen, UA: 0.2 (ref 0.0–1.0)
pH: 5.5 (ref 5.0–8.0)

## 2018-05-26 LAB — CBC WITH DIFFERENTIAL/PLATELET
Basophils Absolute: 0.1 10*3/uL (ref 0.0–0.1)
Basophils Relative: 0.9 % (ref 0.0–3.0)
Eosinophils Absolute: 0.6 10*3/uL (ref 0.0–0.7)
Eosinophils Relative: 7 % — ABNORMAL HIGH (ref 0.0–5.0)
HCT: 41.9 % (ref 36.0–46.0)
Hemoglobin: 14.7 g/dL (ref 12.0–15.0)
Lymphocytes Relative: 27.1 % (ref 12.0–46.0)
Lymphs Abs: 2.4 10*3/uL (ref 0.7–4.0)
MCHC: 35 g/dL (ref 30.0–36.0)
MCV: 89.2 fl (ref 78.0–100.0)
Monocytes Absolute: 1 10*3/uL (ref 0.1–1.0)
Monocytes Relative: 11.4 % (ref 3.0–12.0)
Neutro Abs: 4.8 10*3/uL (ref 1.4–7.7)
Neutrophils Relative %: 53.6 % (ref 43.0–77.0)
Platelets: 189 10*3/uL (ref 150.0–400.0)
RBC: 4.69 Mil/uL (ref 3.87–5.11)
RDW: 12.7 % (ref 11.5–15.5)
WBC: 9 10*3/uL (ref 4.0–10.5)

## 2018-05-26 LAB — HEMOGLOBIN A1C: Hgb A1c MFr Bld: 5.5 % (ref 4.6–6.5)

## 2018-05-26 LAB — LIPID PANEL
CHOLESTEROL: 172 mg/dL (ref 0–200)
HDL: 41 mg/dL (ref >39.00)
LDL CALC: 102 mg/dL — AB (ref 0–99)
NONHDL: 131.47
Total CHOL/HDL Ratio: 4
Triglycerides: 147 mg/dL (ref 0.0–149.0)
VLDL: 29.4 mg/dL (ref 0.0–40.0)

## 2018-05-26 LAB — HEPATIC FUNCTION PANEL
ALT: 19 U/L (ref 0–35)
AST: 23 U/L (ref 0–37)
Albumin: 4.2 g/dL (ref 3.5–5.2)
Alkaline Phosphatase: 41 U/L (ref 39–117)
Bilirubin, Direct: 0.2 mg/dL (ref 0.0–0.3)
Total Bilirubin: 0.6 mg/dL (ref 0.2–1.2)
Total Protein: 7.9 g/dL (ref 6.0–8.3)

## 2018-05-26 LAB — BASIC METABOLIC PANEL WITH GFR
BUN: 24 mg/dL — ABNORMAL HIGH (ref 6–23)
CO2: 26 meq/L (ref 19–32)
Calcium: 10.1 mg/dL (ref 8.4–10.5)
Chloride: 103 meq/L (ref 96–112)
Creatinine, Ser: 1.04 mg/dL (ref 0.40–1.20)
GFR: 51.87 mL/min — ABNORMAL LOW
Glucose, Bld: 103 mg/dL — ABNORMAL HIGH (ref 70–99)
Potassium: 4.2 meq/L (ref 3.5–5.1)
Sodium: 140 meq/L (ref 135–145)

## 2018-05-26 LAB — TSH: TSH: 1.53 u[IU]/mL (ref 0.35–4.50)

## 2018-05-26 MED ORDER — GABAPENTIN 100 MG PO CAPS: 100.0000 mg | ORAL_CAPSULE | Freq: Three times a day (TID) | ORAL | 5 refills | Status: DC

## 2018-05-26 MED ORDER — HYOSCYAMINE SULFATE 0.125 MG SL SUBL: 0.1250 mg | SUBLINGUAL_TABLET | SUBLINGUAL | 3 refills | Status: AC | PRN

## 2018-05-26 MED ORDER — HYDROCODONE-ACETAMINOPHEN 5-325 MG PO TABS: 1.0000 | ORAL_TABLET | Freq: Every evening | ORAL | 0 refills | Status: DC | PRN

## 2018-05-26 NOTE — Telephone Encounter (Signed)
shirron to ask pt about a pharmacy to which to send the cipro antibiotic please, and let me know

## 2018-05-26 NOTE — Patient Instructions (Signed)
Please take all new medication as prescribed - the levsin as needed, the gabapentin 100 mg three times per day, and the hydrocodone at night if needed  Please call in 1 week if the gabapentin helps, but you need a higher dose  We are only allowed to prescribe one week of the hydrocodone to start by law, so call in one week for further hydrocodone if needed  Please continue all other medications as before, and refills have been done if requested.  Please have the pharmacy call with any other refills you may need.  Please continue your efforts at being more active, low cholesterol diet, and weight control.  You are otherwise up to date with prevention measures today.  Please keep your appointments with your specialists as you may have planned  Please go to the LAB in the Basement (turn left off the elevator) for the tests to be done today  You will be contacted by phone if any changes need to be made immediately.  Otherwise, you will receive a letter about your results with an explanation, but please check with MyChart first.  Please remember to sign up for MyChart if you have not done so, as this will be important to you in the future with finding out test results, communicating by private email, and scheduling acute appointments online when needed.  Please return in 6 months, or sooner if needed, with Lab testing done 3-5 days before

## 2018-05-26 NOTE — Progress Notes (Signed)
Subjective:    Patient ID: Sheryl Copeland, female    DOB: November 27, 1945, 73 y.o.   MRN: [id number]  HPI   Here for wellness and f/u;  Overall doing ok;  Pt denies Chest pain, worsening SOB, DOE, wheezing, orthopnea, PND, worsening LE edema, palpitations, dizziness or syncope.  Pt denies neurological change such as new headache, facial or extremity weakness.  Pt denies polydipsia, polyuria, or low sugar symptoms. Pt states overall good compliance with treatment and medications, good tolerability, and has been trying to follow appropriate diet.  Pt denies worsening depressive symptoms, suicidal ideation or panic. No fever, night sweats, wt loss, loss of appetite, or other constitutional symptoms.  Pt states good ability with ADL's, has low fall risk, home safety reviewed and adequate, no other significant changes in hearing or vision, and only occasionally active with exercise. ALso c/o 18 mo persistent cant get to sleep, does not want sleep med, but needs pain med qhs for pain.  Has not tried gabapentin. Pt continues to have recurring LBP without change in severity, bowel or bladder change, fever, wt loss,  worsening LE pain/numbness/weakness, gait change or falls.  Also, Denies worsening reflux, dysphagia, n/v, bowel change or blood except for onging abd discomfort and loose stools as per her IBS, asks for levsin prn refill as this helped before Past Medical History:  Diagnosis Date  . Acute meniscal tear of knee sept 2012   right knee  . Allergy   . Arthritis of foot, right    first mtp  . Asthma   . Blood in stool   . Cervical disc disease    s/p cervical spine surgury 1990  . Chronic low back pain 07/15/2011  . 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 Hyperlipidemia 07/15/2011  . Hypertension   . Hypothyroidism   . IBS (irritable bowel syndrome) 07/03/2011  . Impaired glucose tolerance 07/03/2011  . Insomnia 07/15/2011  . Lumbar disc disease    s/p lumbar fusion 1999  . Migraine   .  Peripheral neuropathy 07/03/2011  . Restrictive lung disease 07/15/2011   Past Surgical History:  Procedure Laterality Date  . ABDOMINAL HYSTERECTOMY  1974  . APPENDECTOMY  1962  . BREAST BIOPSY Left 2014  . CARPAL TUNNEL RELEASE     x 2  . CERVICAL LAMINECTOMY    . CHOLECYSTECTOMY  1967  . HEMORRHOID SURGERY    . LUMBAR FUSION    . TONSILLECTOMY  1967    reports that she has never smoked. She has never used smokeless tobacco. She reports that she does not drink alcohol or use drugs. family history includes Alcohol abuse in some other family members; Arthritis in some other family members; Breast cancer in her mother; Cancer in some other family members; Diabetes in an other family member; Heart disease in an other family member; Hyperlipidemia in an other family member; Hypertension in some other family members; Mental illness in an other family member; Stroke in an other family member. Allergies  Allergen Reactions  . Aspirin Swelling    Throat closes  . Celebrex [Celecoxib] Hives  . Dipyridamole     Per pt: "opposite effect"  . Eggs Or Egg-Derived Products     diarrhea  . Erythromycin Hives  . Gabapentin     Per pt: uknown  . Iodine Swelling    Throat closes  . Penicillins Diarrhea   Current Outpatient Medications on File Prior to Visit  Medication Sig Dispense Refill  . albuterol (  PROVENTIL HFA;VENTOLIN HFA) 108 (90 Base) MCG/ACT inhaler Inhale 2 puffs into the lungs every 6 (six) hours as needed for wheezing or shortness of breath. 1 Inhaler 2  . ARMOUR THYROID 120 MG tablet TAKE 1 TABLET BY MOUTH ONCE DAILY BEFOREBREAKFAST 90 tablet 1  . atenolol (TENORMIN) 50 MG tablet TAKE 1 TABLET BY MOUTH ONCE DAILY 90 tablet 0  . BIOTIN PO Take 2 tablets by mouth daily.     . Cholecalciferol (VITAMIN D3) 1000 units CAPS Take 2 capsules (2,000 Units total) by mouth daily. 60 capsule 0  . colchicine 0.6 MG tablet Take 1 tablet (0.6 mg total) by mouth daily. 90 tablet 3  . EPIPEN 2-PAK  0.3 MG/0.3ML SOAJ injection     . hydrochlorothiazide (MICROZIDE) 12.5 MG capsule TAKE 2 CAPSULES BY MOUTH DAILY 180 capsule 1  . nabumetone (RELAFEN) 500 MG tablet TAKE 1 TABLET BY MOUTH ONCE DAILY 90 tablet 1  . potassium chloride (MICRO-K) 10 MEQ CR capsule TAKE 1 CAPSULE BY MOUTH ONCE DAILY 90 capsule 2  . silver sulfADIAZINE (SILVADENE) 1 % cream Apply 1 application topically daily. 50 g 0   No current facility-administered medications on file prior to visit.    Review of Systems Constitutional: Negative for other unusual diaphoresis, sweats, appetite or weight changes HENT: Negative for other worsening hearing loss, ear pain, facial swelling, mouth sores or neck stiffness.   Eyes: Negative for other worsening pain, redness or other visual disturbance.  Respiratory: Negative for other stridor or swelling Cardiovascular: Negative for other palpitations or other chest pain  Gastrointestinal: Negative for worsening diarrhea or loose stools, blood in stool, distention or other pain Genitourinary: Negative for hematuria, flank pain or other change in urine volume.  Musculoskeletal: Negative for myalgias or other joint swelling.  Skin: Negative for other color change, or other wound or worsening drainage.  Neurological: Negative for other syncope or numbness. Hematological: Negative for other adenopathy or swelling Psychiatric/Behavioral: Negative for hallucinations, other worsening agitation, SI, self-injury, or new decreased concentration All other system neg per pt    Objective:   Physical Exam BP (!) 144/92   Pulse 63   Temp 98.2 F (36.8 C) (Oral)   Ht 5' 3.5" (1.613 m)   Wt 205 lb (93 kg)   SpO2 92%   BMI 35.74 kg/m  VS noted,  Constitutional: Pt is oriented to person, place, and time. Appears well-developed and well-nourished, in no significant distress and comfortable Head: Normocephalic and atraumatic  Eyes: Conjunctivae and EOM are normal. Pupils are equal, round, and  reactive to light Right Ear: External ear normal without discharge Left Ear: External ear normal without discharge Nose: Nose without discharge or deformity Mouth/Throat: Oropharynx is without other ulcerations and moist  Neck: Normal range of motion. Neck supple. No JVD present. No tracheal deviation present or significant neck LA or mass Cardiovascular: Normal rate, regular rhythm, normal heart sounds and intact distal pulses.   Pulmonary/Chest: WOB normal and breath sounds without rales or wheezing  Abdominal: Soft. Bowel sounds are normal. NT. No HSM  Musculoskeletal: Normal range of motion. Exhibits no edema, chronic tender left lumbar paravertebral Lymphadenopathy: Has no other cervical adenopathy.  Neurological: Pt is alert and oriented to person, place, and time. Pt has normal reflexes. No cranial nerve deficit. Motor grossly intact, Gait intact Skin: Skin is warm and dry. No rash noted or new ulcerations Psychiatric:  Has normal mood and affect. Behavior is normal without agitation\ No other exam findings  Lab  Results  Component Value Date   WBC 7.8 07/23/2016   HGB 14.2 07/23/2016   HCT 41.1 07/23/2016   PLT 193.0 07/23/2016   GLUCOSE 114 (H) 07/23/2016   CHOL 151 07/23/2016   TRIG 131.0 07/23/2016   HDL 39.20 07/23/2016   LDLCALC 86 07/23/2016   ALT 19 07/23/2016   AST 26 07/23/2016   NA 140 07/23/2016   K 3.8 07/23/2016   CL 104 07/23/2016   CREATININE 1.02 07/23/2016   BUN 24 (H) 07/23/2016   CO2 27 07/23/2016   TSH 1.76 07/23/2016   HGBA1C 5.4 07/23/2016       Assessment & Plan:

## 2018-05-26 NOTE — Assessment & Plan Note (Signed)
stable overall by history and exam, recent data reviewed with pt, and pt to continue medical treatment as before,  to f/u any worsening symptoms or concerns for a1c with labs 

## 2018-05-26 NOTE — Assessment & Plan Note (Addendum)
Neuro no change, for vicodin 1 qhs prn, also gabapentin 100 tid trial  In addition to the time spent performing CPE, I spent an additional 25 minutes face to face,in which greater than 50% of this time was spent in counseling and coordination of care for patient's acute illness as documented, including the differential dx, treatment, further evaluation and other management of chronic lbp, htn, hyeprglycemia, IBS

## 2018-05-26 NOTE — Assessment & Plan Note (Signed)

## 2018-05-26 NOTE — Assessment & Plan Note (Signed)
Overall stable, for levsin refill

## 2018-05-26 NOTE — Assessment & Plan Note (Signed)
stable overall by history and exam, recent data reviewed with pt, and pt to continue medical treatment as before,  to f/u any worsening symptoms or concerns  

## 2018-05-27 ENCOUNTER — Telehealth: Payer: Self-pay

## 2018-05-27 ENCOUNTER — Telehealth: Payer: Self-pay | Admitting: Internal Medicine

## 2018-05-27 MED ORDER — CIPROFLOXACIN HCL 500 MG PO TABS: 500.0000 mg | ORAL_TABLET | Freq: Two times a day (BID) | ORAL | 0 refills | Status: DC

## 2018-05-27 NOTE — Telephone Encounter (Signed)
Ok, this is done 

## 2018-05-27 NOTE — Telephone Encounter (Signed)
Called pt, LVM.   CRM created.  

## 2018-05-27 NOTE — Telephone Encounter (Signed)
-----   Message from Corwin LevinsJames W John, MD sent at 05/26/2018  7:09 PM EST ----- Left message on MyChart, pt to cont same tx except  The test results show that your current treatment is OK, as the tests are stable, except for the urine testing consistent with probable urinary tract infection.  We will send an antibiotic, and you should hear from the office as well.    Shirron to please inform pt, I will do rx

## 2018-05-27 NOTE — Telephone Encounter (Signed)
Pt would like RX sent to  [id number] [place] - [address] [phone] (Phone) [phone] (Fax)

## 2018-05-27 NOTE — Telephone Encounter (Signed)
Called pt, LVM.   

## 2018-05-27 NOTE — Addendum Note (Signed)
Addended by: Corwin LevinsJOHN, JAMES W on: 05/27/2018 12:20 PM   Modules accepted: Orders

## 2018-05-27 NOTE — Telephone Encounter (Signed)
Pt given results and documented in result note 

## 2018-05-27 NOTE — Telephone Encounter (Signed)
Copied from CRM [id number]. Topic: Quick Communication - Lab Results (Clinic Use ONLY) >> May 27, 2018 11:19 AM Roney Mans, CMA wrote:  Discuss LAB results. Ok for [facility] to inform. >> May 27, 2018 12:41 PM Zada Girt, Lumin L wrote: Patient called back for results. No [facility] RN available. Patient would like a call back today around 5pm

## 2018-05-28 ENCOUNTER — Other Ambulatory Visit: Payer: Self-pay | Admitting: Internal Medicine

## 2018-05-28 MED ORDER — THYROID 120 MG PO TABS: ORAL_TABLET | ORAL | 1 refills | Status: DC

## 2018-05-28 MED ORDER — NABUMETONE 500 MG PO TABS: 500.0000 mg | ORAL_TABLET | Freq: Every day | ORAL | 2 refills | Status: DC

## 2018-05-28 MED ORDER — POTASSIUM CHLORIDE ER 10 MEQ PO CPCR: 10.0000 meq | ORAL_CAPSULE | Freq: Every day | ORAL | 0 refills | Status: DC

## 2018-05-28 MED ORDER — ATENOLOL 50 MG PO TABS: 50.0000 mg | ORAL_TABLET | Freq: Every day | ORAL | 2 refills | Status: DC

## 2018-05-28 NOTE — Telephone Encounter (Signed)
Copied from CRM [id number]. Topic: Quick Communication - Rx Refill/Question >> May 28, 2018  3:54 PM Lorrine Kin, NT wrote: **Patients changed pharmacies and the pharmacy was unable to pull from previous pharmacy. Please advise**  Medication: nabumetone (RELAFEN) 500 MG tablet  hydrochlorothiazide (MICROZIDE) 12.5 MG capsule atenolol (TENORMIN) 50 MG tablet  ARMOUR THYROID 120 MG tablet potassium chloride (MICRO-K) 10 MEQ CR capsule  Has the patient contacted their pharmacy? Yes.   (Agent: If no, request that the patient contact the pharmacy for the refill.) (Agent: If yes, when and what did the pharmacy advise?)  Preferred Pharmacy (with phone number or street name): [id number] - [place], [place] - [address]  Agent: Please be advised that RX refills may take up to 3 business days. We ask that you follow-up with your pharmacy.

## 2018-05-30 ENCOUNTER — Ambulatory Visit: Payer: Self-pay | Admitting: *Deleted

## 2018-05-30 MED ORDER — NITROFURANTOIN MACROCRYSTAL 50 MG PO CAPS: 50.0000 mg | ORAL_CAPSULE | Freq: Two times a day (BID) | ORAL | 0 refills | Status: DC

## 2018-05-30 MED ORDER — HYDROCHLOROTHIAZIDE 12.5 MG PO CAPS: 25.0000 mg | ORAL_CAPSULE | Freq: Every day | ORAL | 1 refills | Status: DC

## 2018-05-30 NOTE — Telephone Encounter (Signed)
Done erx 

## 2018-05-30 NOTE — Telephone Encounter (Signed)
Pt states the hydrochlorothiazide (MICROZIDE) 12.5 MG capsule was not called in. Please advise.

## 2018-05-30 NOTE — Addendum Note (Signed)
Addended by: Corwin Levins on: 05/30/2018 01:20 PM   Modules accepted: Orders

## 2018-05-30 NOTE — Addendum Note (Signed)
Addended by: Amado Coe on: 05/30/2018 12:23 PM   Modules accepted: Orders

## 2018-05-30 NOTE — Telephone Encounter (Signed)
Patient states the ciprofloxacin (CIPRO) 500 MG tablet is making the back of her legs, feet and hands cramp. 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 She started the medication yesterday 1/21  Reason for Disposition . [1] Request for URGENT new prescription or refill of "essential" medication (i.e., likelihood of harm to patient if not taken) AND [2] triager unable to fill per unit policy  Answer Assessment - Initial Assessment Questions 1. SYMPTOMS: "Do you have any symptoms?"     Patient is having cramping in back of legs into feet- patient states it started as soon as she started the medication 2. SEVERITY: If symptoms are present, ask "Are they mild, moderate or severe?"     severe  Protocols used: MEDICATION QUESTION CALL-A-AH  Patient has stopped the medication today- she would like an alternative. Please call patient- her pharmacy does not notify her.

## 2018-05-30 NOTE — Telephone Encounter (Signed)
Requested Prescriptions  Pending Prescriptions Disp Refills  . hydrochlorothiazide (MICROZIDE) 12.5 MG capsule 180 capsule 1    Sig: Take 2 capsules (25 mg total) by mouth daily.     Cardiovascular: Diuretics - Thiazide Failed - 05/30/2018 12:23 PM      Failed - Last BP in normal range    BP Readings from Last 1 Encounters:  05/26/18 (!) 144/92         Passed - Ca in normal range and within 360 days    Calcium  Date Value Ref Range Status  05/26/2018 10.1 8.4 - 10.5 mg/dL Final         Passed - Cr in normal range and within 360 days    Creatinine, Ser  Date Value Ref Range Status  05/26/2018 1.04 0.40 - 1.20 mg/dL Final         Passed - K in normal range and within 360 days    Potassium  Date Value Ref Range Status  05/26/2018 4.2 3.5 - 5.1 mEq/L Final         Passed - Na in normal range and within 360 days    Sodium  Date Value Ref Range Status  05/26/2018 140 135 - 145 mEq/L Final         Passed - Valid encounter within last 6 months    Recent Outpatient Visits          4 days ago Encounter for well adult exam with abnormal findings   [facility] [facility], James W, MD   1 year ago Urinary frequency   [facility] John, James W, MD   1 year ago Preventative health care   [facility] [facility], James W, MD   2 years ago Impaired glucose tolerance   [facility] John, James W, MD   2 years ago Preventative health care   [facility] [facility], James W, MD      Future Appointments            In 5 months Corwin LevinsJohn, James W, MD [facility]         Signed Prescriptions Disp Refills   nabumetone (RELAFEN) 500 MG tablet 90 tablet 2    Sig: Take 1 tablet (500 mg total) by mouth daily.     Analgesics:  NSAIDS Passed - 05/28/2018  4:10 PM      Passed - Cr in normal range and within 360 days    Creatinine, Ser  Date Value Ref  Range Status  05/26/2018 1.04 0.40 - 1.20 mg/dL Final         Passed - HGB in normal range and within 360 days    Hemoglobin  Date Value Ref Range Status  05/26/2018 14.7 12.0 - 15.0 g/dL Final         Passed - Patient is not pregnant      Passed - Valid encounter within last 12 months    Recent Outpatient Visits          4 days ago Encounter for well adult exam with abnormal findings   [facility] [facility], James W, MD   1 year ago Urinary frequency   [facility] [facility], James W, MD   1 year ago Preventative health care   [facility] [facility], James W, MD   2 years ago Impaired glucose tolerance  [facility] [facility], James W, MD   2 years ago Preventative health care   [facility] [facility], James W, MD      Future Appointments            In 5 months Corwin LevinsJohn, James W, MD [facility], [facility]          atenolol (TENORMIN) 50 MG tablet 90 tablet 2    Sig: Take 1 tablet (50 mg total) by mouth daily.     Cardiovascular:  Beta Blockers Failed - 05/28/2018  4:10 PM      Failed - Last BP in normal range    BP Readings from Last 1 Encounters:  05/26/18 (!) 144/92         Passed - Last Heart Rate in normal range    Pulse Readings from Last 1 Encounters:  05/26/18 63         Passed - Valid encounter within last 6 months    Recent Outpatient Visits          4 days ago Encounter for well adult exam with abnormal findings   [facility] [facility], James W, MD   1 year ago Urinary frequency   [facility] [facility], James W, MD   1 year ago Preventative health care   [facility] [facility], James W, MD   2 years ago Impaired glucose tolerance   [facility] John, James W, MD   2 years ago Preventative health care   [facility] [facility],  James W, MD      Future Appointments            In 5 months Jonny RuizJohn, Len BlalockJames W, MD [facility], [facility]          thyroid (ARMOUR THYROID) 120 MG tablet 90 tablet 1    Sig: TAKE 1 TABLET BY MOUTH ONCE DAILY BEFOREBREAKFAST     Endocrinology:  Hypothyroid Agents Failed - 05/28/2018  4:10 PM      Failed - TSH needs to be rechecked within 3 months after an abnormal result. Refill until TSH is due.      Passed - TSH in normal range and within 360 days    TSH  Date Value Ref Range Status  05/26/2018 1.53 0.35 - 4.50 uIU/mL Final         Passed - Valid encounter within last 12 months    Recent Outpatient Visits          4 days ago Encounter for well adult exam with abnormal findings   [facility] [facility], James W, MD   1 year ago Urinary frequency   [facility] John, James W, MD   1 year ago Preventative health care   [facility] [facility], James W, MD   2 years ago Impaired glucose tolerance   [facility] [facility], James W, MD   2 years ago Preventative health care   [facility] [facility], James W, MD      Future Appointments            In 5 months Jonny RuizJohn, Len BlalockJames W, MD [facility], [facility]          potassium chloride (MICRO-K) 10 MEQ CR capsule 90 capsule 0    Sig: Take  1 capsule (10 mEq total) by mouth daily.     Endocrinology:  Minerals - Potassium Supplementation Passed - 05/28/2018  4:10 PM      Passed - K in normal range and within 360 days    Potassium  Date Value Ref Range Status  05/26/2018 4.2 3.5 - 5.1 mEq/L Final         Passed - Cr in normal range and within 360 days    Creatinine, Ser  Date Value Ref Range Status  05/26/2018 1.04 0.40 - 1.20 mg/dL Final         Passed - Valid encounter within last 12 months    Recent Outpatient Visits          4 days ago Encounter for well adult exam with abnormal  findings   [facility], Len Blalock, MD   1 year ago Urinary frequency   [facility] Sh[facility], Len Blalock, MD   1 year ago Preventative health care   [facility], MD   2 years ago Impaired glucose tolerance   [facility], MD   2 years ago Preventative health care   [facility], MD      Future Appointments            In 5 months Jonny Ruiz, Len Blalock, MD [facility] -[place], [facility]

## 2018-06-02 ENCOUNTER — Telehealth: Payer: Self-pay | Admitting: Internal Medicine

## 2018-06-02 MED ORDER — NITROFURANTOIN MACROCRYSTAL 50 MG PO CAPS: 50.0000 mg | ORAL_CAPSULE | Freq: Two times a day (BID) | ORAL | 0 refills | Status: DC

## 2018-06-02 NOTE — Telephone Encounter (Signed)
[facility] (MACR[facility]ANTIN) 50 MG capsule  And [facility]   [id number] [place] - [address]  [address] [place] [address]  Phone: [phone] Fax: [phone]

## 2018-06-02 NOTE — Telephone Encounter (Signed)
Copied from CRM [id number]. Topic: Quick Communication - See Telephone Encounter >> Jun 02, 2018 10:49 AM Windy KalataMichael, Kong Packett L, NT wrote: CRM for notification. See Telephone encounter for: 06/02/18.  Patient is requesting a refill on HYDROcodone-acetaminophen (NORCO/VICODIN) 5-325 MG tablet. [facility] and needs to sent to the pharmacy below.  [facility] [place] [address] [place] [id number] Phone: [phone] Fax: [phone]

## 2018-07-14 ENCOUNTER — Telehealth: Payer: Self-pay | Admitting: Internal Medicine

## 2018-07-14 DIAGNOSIS — J0100 Acute maxillary sinusitis, unspecified: Secondary | ICD-10-CM | POA: Diagnosis not present

## 2018-07-14 DIAGNOSIS — J209 Acute bronchitis, unspecified: Secondary | ICD-10-CM | POA: Diagnosis not present

## 2018-07-14 NOTE — Telephone Encounter (Signed)
Copied from CRM [id number]. Topic: Quick Communication - Rx Refill/Question >> Jul 14, 2018  8:13 AM Maia Petties wrote: Medication: hyoscyamine (LEVSIN SL) 0.125 MG SL tablet, gabapentin (NEURONTIN) 100 MG capsule, thyroid (ARMOUR THYROID) 120 MG tablet, potassium chloride (MICRO-K) 10 MEQ CR capsule, nabumetone (RELAFEN) 500 MG tablet, atenolol (TENORMIN) 50 MG tablet, hydrochlorothiazide (MICROZIDE) 12.5 MG capsule, nitrofurantoin (MACRODANTIN) 50 MG capsule - [facility] - both pharmacies deny having prescriptions for the past month - pt needing 90 day supply please  HYDROcodone-acetaminophen (NORCO/VICODIN) 5-325 MG tablet - pt states she was told to call in every week for pain medication  Has the patient contacted their pharmacy? Yes [facility] and [facility] states they do not have prescriptions for the patient Preferred Pharmacy (with phone number or street name): [facility] - [place], [place] - [address] [phone] (Phone) [phone] (Fax)

## 2018-07-14 NOTE — Telephone Encounter (Signed)
[facility]- patient has RF on all medications except potasium (she should not be out of) and antibiotic. If she wants to change Pharmacies- she just needs to call the pharmacy she wants to use and have her Rx transferred.  Attempted to call patient- left her a message that [facility]- she can have them transferred if she wishes.   Patient may need to request a refill on her pain medication.

## 2018-07-14 NOTE — Telephone Encounter (Signed)
Noted  

## 2018-08-20 ENCOUNTER — Other Ambulatory Visit: Payer: Self-pay | Admitting: Internal Medicine

## 2018-09-19 DIAGNOSIS — M5432 Sciatica, left side: Secondary | ICD-10-CM | POA: Diagnosis not present

## 2018-10-18 ENCOUNTER — Other Ambulatory Visit: Payer: Self-pay | Admitting: Internal Medicine

## 2018-10-23 DIAGNOSIS — K589 Irritable bowel syndrome without diarrhea: Secondary | ICD-10-CM | POA: Diagnosis not present

## 2018-10-23 DIAGNOSIS — E782 Mixed hyperlipidemia: Secondary | ICD-10-CM | POA: Diagnosis not present

## 2018-10-23 DIAGNOSIS — M1A09X0 Idiopathic chronic gout, multiple sites, without tophus (tophi): Secondary | ICD-10-CM | POA: Diagnosis not present

## 2018-10-23 DIAGNOSIS — M519 Unspecified thoracic, thoracolumbar and lumbosacral intervertebral disc disorder: Secondary | ICD-10-CM | POA: Diagnosis not present

## 2018-10-23 DIAGNOSIS — R609 Edema, unspecified: Secondary | ICD-10-CM | POA: Diagnosis not present

## 2018-10-23 DIAGNOSIS — G629 Polyneuropathy, unspecified: Secondary | ICD-10-CM | POA: Diagnosis not present

## 2018-10-23 DIAGNOSIS — R7303 Prediabetes: Secondary | ICD-10-CM | POA: Diagnosis not present

## 2018-10-23 DIAGNOSIS — I10 Essential (primary) hypertension: Secondary | ICD-10-CM | POA: Diagnosis not present

## 2018-10-23 DIAGNOSIS — J4520 Mild intermittent asthma, uncomplicated: Secondary | ICD-10-CM | POA: Diagnosis not present

## 2018-10-23 DIAGNOSIS — J984 Other disorders of lung: Secondary | ICD-10-CM | POA: Diagnosis not present

## 2018-10-23 DIAGNOSIS — E039 Hypothyroidism, unspecified: Secondary | ICD-10-CM | POA: Diagnosis not present

## 2018-10-23 DIAGNOSIS — M545 Low back pain: Secondary | ICD-10-CM | POA: Diagnosis not present

## 2018-11-17 ENCOUNTER — Other Ambulatory Visit: Payer: Self-pay | Admitting: Internal Medicine

## 2018-11-24 ENCOUNTER — Ambulatory Visit: Payer: Medicare HMO | Admitting: Internal Medicine

## 2019-01-26 ENCOUNTER — Other Ambulatory Visit: Payer: Self-pay | Admitting: Internal Medicine

## 2019-01-26 DIAGNOSIS — Z1231 Encounter for screening mammogram for malignant neoplasm of breast: Secondary | ICD-10-CM

## 2019-02-23 ENCOUNTER — Other Ambulatory Visit: Payer: Self-pay | Admitting: Internal Medicine

## 2019-03-10 ENCOUNTER — Ambulatory Visit
Admission: RE | Admit: 2019-03-10 | Discharge: 2019-03-10 | Disposition: A | Discharge status: Home / Self Care | Payer: PPO | Source: Ambulatory Visit | Attending: Internal Medicine | Admitting: Internal Medicine

## 2019-03-10 ENCOUNTER — Other Ambulatory Visit: Payer: Self-pay

## 2019-03-10 ENCOUNTER — Other Ambulatory Visit: Payer: Self-pay | Admitting: Internal Medicine

## 2019-03-10 DIAGNOSIS — Z1231 Encounter for screening mammogram for malignant neoplasm of breast: Secondary | ICD-10-CM | POA: Diagnosis not present

## 2019-04-27 ENCOUNTER — Other Ambulatory Visit: Payer: Self-pay | Admitting: Internal Medicine

## 2019-04-27 DIAGNOSIS — J4520 Mild intermittent asthma, uncomplicated: Secondary | ICD-10-CM | POA: Diagnosis not present

## 2019-04-27 DIAGNOSIS — R609 Edema, unspecified: Secondary | ICD-10-CM | POA: Diagnosis not present

## 2019-04-27 DIAGNOSIS — I10 Essential (primary) hypertension: Secondary | ICD-10-CM | POA: Diagnosis not present

## 2019-04-27 DIAGNOSIS — E039 Hypothyroidism, unspecified: Secondary | ICD-10-CM | POA: Diagnosis not present

## 2019-04-27 DIAGNOSIS — M545 Low back pain: Secondary | ICD-10-CM | POA: Diagnosis not present

## 2019-04-27 DIAGNOSIS — G629 Polyneuropathy, unspecified: Secondary | ICD-10-CM | POA: Diagnosis not present

## 2019-04-27 DIAGNOSIS — J984 Other disorders of lung: Secondary | ICD-10-CM | POA: Diagnosis not present

## 2019-04-27 DIAGNOSIS — R7303 Prediabetes: Secondary | ICD-10-CM | POA: Diagnosis not present

## 2019-04-27 DIAGNOSIS — M1A09X0 Idiopathic chronic gout, multiple sites, without tophus (tophi): Secondary | ICD-10-CM | POA: Diagnosis not present

## 2019-04-27 DIAGNOSIS — F5101 Primary insomnia: Secondary | ICD-10-CM | POA: Diagnosis not present

## 2019-04-27 DIAGNOSIS — K589 Irritable bowel syndrome without diarrhea: Secondary | ICD-10-CM | POA: Diagnosis not present

## 2019-04-27 DIAGNOSIS — E782 Mixed hyperlipidemia: Secondary | ICD-10-CM | POA: Diagnosis not present

## 2019-05-05 ENCOUNTER — Other Ambulatory Visit: Payer: Self-pay | Admitting: Internal Medicine

## 2019-05-11 DIAGNOSIS — G629 Polyneuropathy, unspecified: Secondary | ICD-10-CM | POA: Diagnosis not present

## 2019-05-11 DIAGNOSIS — M545 Low back pain: Secondary | ICD-10-CM | POA: Diagnosis not present

## 2019-05-11 DIAGNOSIS — R609 Edema, unspecified: Secondary | ICD-10-CM | POA: Diagnosis not present

## 2019-05-11 DIAGNOSIS — G8929 Other chronic pain: Secondary | ICD-10-CM | POA: Diagnosis not present

## 2019-06-02 ENCOUNTER — Other Ambulatory Visit: Payer: Self-pay | Admitting: Internal Medicine

## 2019-07-06 ENCOUNTER — Other Ambulatory Visit: Payer: Self-pay | Admitting: Internal Medicine

## 2019-07-08 DIAGNOSIS — M25462 Effusion, left knee: Secondary | ICD-10-CM | POA: Diagnosis not present

## 2019-07-08 DIAGNOSIS — M25562 Pain in left knee: Secondary | ICD-10-CM | POA: Diagnosis not present

## 2019-07-15 ENCOUNTER — Other Ambulatory Visit: Payer: Self-pay | Admitting: Internal Medicine

## 2019-07-22 DIAGNOSIS — I872 Venous insufficiency (chronic) (peripheral): Secondary | ICD-10-CM | POA: Diagnosis not present

## 2019-07-22 DIAGNOSIS — I129 Hypertensive chronic kidney disease with stage 1 through stage 4 chronic kidney disease, or unspecified chronic kidney disease: Secondary | ICD-10-CM | POA: Diagnosis not present

## 2019-07-22 DIAGNOSIS — N1831 Chronic kidney disease, stage 3a: Secondary | ICD-10-CM | POA: Insufficient documentation

## 2019-07-22 DIAGNOSIS — Z6836 Body mass index (BMI) 36.0-36.9, adult: Secondary | ICD-10-CM | POA: Diagnosis not present

## 2019-07-22 DIAGNOSIS — M159 Polyosteoarthritis, unspecified: Secondary | ICD-10-CM | POA: Insufficient documentation

## 2019-07-22 DIAGNOSIS — R609 Edema, unspecified: Secondary | ICD-10-CM | POA: Diagnosis not present

## 2019-07-22 DIAGNOSIS — M1A09X0 Idiopathic chronic gout, multiple sites, without tophus (tophi): Secondary | ICD-10-CM | POA: Diagnosis not present

## 2019-07-22 DIAGNOSIS — E039 Hypothyroidism, unspecified: Secondary | ICD-10-CM | POA: Diagnosis not present

## 2019-07-22 DIAGNOSIS — M8949 Other hypertrophic osteoarthropathy, multiple sites: Secondary | ICD-10-CM | POA: Diagnosis not present

## 2019-07-22 DIAGNOSIS — M519 Unspecified thoracic, thoracolumbar and lumbosacral intervertebral disc disorder: Secondary | ICD-10-CM | POA: Diagnosis not present

## 2019-08-05 ENCOUNTER — Other Ambulatory Visit: Payer: Self-pay | Admitting: Internal Medicine

## 2019-08-25 DIAGNOSIS — N1831 Chronic kidney disease, stage 3a: Secondary | ICD-10-CM | POA: Diagnosis not present

## 2019-08-25 DIAGNOSIS — M8949 Other hypertrophic osteoarthropathy, multiple sites: Secondary | ICD-10-CM | POA: Diagnosis not present

## 2019-08-25 DIAGNOSIS — R609 Edema, unspecified: Secondary | ICD-10-CM | POA: Diagnosis not present

## 2019-08-25 DIAGNOSIS — I129 Hypertensive chronic kidney disease with stage 1 through stage 4 chronic kidney disease, or unspecified chronic kidney disease: Secondary | ICD-10-CM | POA: Diagnosis not present

## 2019-08-25 DIAGNOSIS — M1A09X0 Idiopathic chronic gout, multiple sites, without tophus (tophi): Secondary | ICD-10-CM | POA: Diagnosis not present

## 2019-10-26 DIAGNOSIS — M1A09X0 Idiopathic chronic gout, multiple sites, without tophus (tophi): Secondary | ICD-10-CM | POA: Diagnosis not present

## 2019-10-26 DIAGNOSIS — R609 Edema, unspecified: Secondary | ICD-10-CM | POA: Diagnosis not present

## 2019-10-26 DIAGNOSIS — E782 Mixed hyperlipidemia: Secondary | ICD-10-CM | POA: Diagnosis not present

## 2019-10-26 DIAGNOSIS — M8949 Other hypertrophic osteoarthropathy, multiple sites: Secondary | ICD-10-CM | POA: Diagnosis not present

## 2019-10-26 DIAGNOSIS — I872 Venous insufficiency (chronic) (peripheral): Secondary | ICD-10-CM | POA: Diagnosis not present

## 2019-10-26 DIAGNOSIS — N1831 Chronic kidney disease, stage 3a: Secondary | ICD-10-CM | POA: Diagnosis not present

## 2019-10-26 DIAGNOSIS — G629 Polyneuropathy, unspecified: Secondary | ICD-10-CM | POA: Diagnosis not present

## 2019-10-26 DIAGNOSIS — I129 Hypertensive chronic kidney disease with stage 1 through stage 4 chronic kidney disease, or unspecified chronic kidney disease: Secondary | ICD-10-CM | POA: Diagnosis not present

## 2019-10-26 DIAGNOSIS — R7303 Prediabetes: Secondary | ICD-10-CM | POA: Diagnosis not present

## 2019-10-26 DIAGNOSIS — Z6836 Body mass index (BMI) 36.0-36.9, adult: Secondary | ICD-10-CM | POA: Diagnosis not present

## 2019-10-26 DIAGNOSIS — E039 Hypothyroidism, unspecified: Secondary | ICD-10-CM | POA: Diagnosis not present

## 2020-02-01 ENCOUNTER — Other Ambulatory Visit: Payer: Self-pay | Admitting: Specialist

## 2020-02-01 DIAGNOSIS — Z1231 Encounter for screening mammogram for malignant neoplasm of breast: Secondary | ICD-10-CM

## 2020-02-24 DIAGNOSIS — E782 Mixed hyperlipidemia: Secondary | ICD-10-CM | POA: Diagnosis not present

## 2020-02-24 DIAGNOSIS — R609 Edema, unspecified: Secondary | ICD-10-CM | POA: Diagnosis not present

## 2020-02-24 DIAGNOSIS — Z6835 Body mass index (BMI) 35.0-35.9, adult: Secondary | ICD-10-CM | POA: Diagnosis not present

## 2020-02-24 DIAGNOSIS — R7303 Prediabetes: Secondary | ICD-10-CM | POA: Diagnosis not present

## 2020-02-24 DIAGNOSIS — I10 Essential (primary) hypertension: Secondary | ICD-10-CM | POA: Diagnosis not present

## 2020-02-24 DIAGNOSIS — M1A09X0 Idiopathic chronic gout, multiple sites, without tophus (tophi): Secondary | ICD-10-CM | POA: Diagnosis not present

## 2020-02-24 DIAGNOSIS — Z0000 Encounter for general adult medical examination without abnormal findings: Secondary | ICD-10-CM | POA: Diagnosis not present

## 2020-02-24 DIAGNOSIS — M8949 Other hypertrophic osteoarthropathy, multiple sites: Secondary | ICD-10-CM | POA: Diagnosis not present

## 2020-02-24 DIAGNOSIS — N1831 Chronic kidney disease, stage 3a: Secondary | ICD-10-CM | POA: Diagnosis not present

## 2020-02-24 DIAGNOSIS — I129 Hypertensive chronic kidney disease with stage 1 through stage 4 chronic kidney disease, or unspecified chronic kidney disease: Secondary | ICD-10-CM | POA: Diagnosis not present

## 2020-02-24 DIAGNOSIS — E039 Hypothyroidism, unspecified: Secondary | ICD-10-CM | POA: Diagnosis not present

## 2020-03-14 ENCOUNTER — Other Ambulatory Visit: Payer: Self-pay

## 2020-03-14 ENCOUNTER — Ambulatory Visit
Admission: RE | Admit: 2020-03-14 | Discharge: 2020-03-14 | Disposition: A | Discharge status: Home / Self Care | Payer: PPO | Source: Ambulatory Visit | Attending: Specialist | Admitting: Specialist

## 2020-03-14 DIAGNOSIS — Z1231 Encounter for screening mammogram for malignant neoplasm of breast: Secondary | ICD-10-CM

## 2020-08-24 DIAGNOSIS — E039 Hypothyroidism, unspecified: Secondary | ICD-10-CM | POA: Diagnosis not present

## 2020-08-24 DIAGNOSIS — N1831 Chronic kidney disease, stage 3a: Secondary | ICD-10-CM | POA: Diagnosis not present

## 2020-08-24 DIAGNOSIS — M159 Polyosteoarthritis, unspecified: Secondary | ICD-10-CM | POA: Diagnosis not present

## 2020-08-24 DIAGNOSIS — I129 Hypertensive chronic kidney disease with stage 1 through stage 4 chronic kidney disease, or unspecified chronic kidney disease: Secondary | ICD-10-CM | POA: Diagnosis not present

## 2020-08-24 DIAGNOSIS — R7303 Prediabetes: Secondary | ICD-10-CM | POA: Diagnosis not present

## 2020-08-24 DIAGNOSIS — I10 Essential (primary) hypertension: Secondary | ICD-10-CM | POA: Diagnosis not present

## 2020-08-24 DIAGNOSIS — M1A09X0 Idiopathic chronic gout, multiple sites, without tophus (tophi): Secondary | ICD-10-CM | POA: Diagnosis not present

## 2020-08-24 DIAGNOSIS — M519 Unspecified thoracic, thoracolumbar and lumbosacral intervertebral disc disorder: Secondary | ICD-10-CM | POA: Diagnosis not present

## 2020-09-20 DIAGNOSIS — G629 Polyneuropathy, unspecified: Secondary | ICD-10-CM | POA: Diagnosis not present

## 2020-09-20 DIAGNOSIS — M519 Unspecified thoracic, thoracolumbar and lumbosacral intervertebral disc disorder: Secondary | ICD-10-CM | POA: Diagnosis not present

## 2020-09-20 DIAGNOSIS — N1831 Chronic kidney disease, stage 3a: Secondary | ICD-10-CM | POA: Diagnosis not present

## 2020-09-20 DIAGNOSIS — I129 Hypertensive chronic kidney disease with stage 1 through stage 4 chronic kidney disease, or unspecified chronic kidney disease: Secondary | ICD-10-CM | POA: Diagnosis not present

## 2020-09-20 DIAGNOSIS — M1A09X0 Idiopathic chronic gout, multiple sites, without tophus (tophi): Secondary | ICD-10-CM | POA: Diagnosis not present

## 2020-09-27 DIAGNOSIS — N1831 Chronic kidney disease, stage 3a: Secondary | ICD-10-CM | POA: Diagnosis not present

## 2021-02-17 ENCOUNTER — Other Ambulatory Visit: Payer: Self-pay | Admitting: Specialist

## 2021-02-17 DIAGNOSIS — Z1231 Encounter for screening mammogram for malignant neoplasm of breast: Secondary | ICD-10-CM

## 2021-03-21 ENCOUNTER — Other Ambulatory Visit: Payer: Self-pay

## 2021-03-21 ENCOUNTER — Ambulatory Visit
Admission: RE | Admit: 2021-03-21 | Discharge: 2021-03-21 | Disposition: A | Discharge status: Home / Self Care | Payer: PPO | Source: Ambulatory Visit | Attending: Specialist | Admitting: Specialist

## 2021-03-21 DIAGNOSIS — Z1231 Encounter for screening mammogram for malignant neoplasm of breast: Secondary | ICD-10-CM | POA: Diagnosis not present

## 2021-06-15 ENCOUNTER — Ambulatory Visit (INDEPENDENT_AMBULATORY_CARE_PROVIDER_SITE_OTHER): Payer: PPO | Admitting: Family Medicine

## 2021-06-15 ENCOUNTER — Encounter: Payer: Self-pay | Admitting: Family Medicine

## 2021-06-15 ENCOUNTER — Other Ambulatory Visit: Payer: Self-pay

## 2021-06-15 VITALS — BP 140/70 | HR 60 | Temp 97.1°F | Resp 18 | Ht 61.0 in | Wt 200.4 lb

## 2021-06-15 DIAGNOSIS — R7303 Prediabetes: Secondary | ICD-10-CM

## 2021-06-15 DIAGNOSIS — E039 Hypothyroidism, unspecified: Secondary | ICD-10-CM

## 2021-06-15 DIAGNOSIS — I10 Essential (primary) hypertension: Secondary | ICD-10-CM

## 2021-06-15 DIAGNOSIS — G629 Polyneuropathy, unspecified: Secondary | ICD-10-CM

## 2021-06-15 DIAGNOSIS — M1A09X0 Idiopathic chronic gout, multiple sites, without tophus (tophi): Secondary | ICD-10-CM

## 2021-06-15 DIAGNOSIS — M2559 Pain in other specified joint: Secondary | ICD-10-CM | POA: Diagnosis not present

## 2021-06-15 NOTE — Patient Instructions (Signed)
Consider uloric (febuxostat) for gout.   Consider lyrica (pregabalin) for neuropathy.

## 2021-06-15 NOTE — Progress Notes (Signed)
Subjective:  Patient ID: Sheryl Copeland, female    DOB: 11-18-1945  Age: 76 y.o. 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 ON relafen 500 mg once daily. Patient has had left knee pain > right knee pain.  Fallen 4 times over the last year.  Patient has loss of sensation of bl arms and legs. Off balance.   Gout: started in toes. Right knee hurts. ON colchicine. HYPERTENSION:  on atenolol 50 mg daily, losartan 50 g daily, hctz 12.5 mg 2 daily. On potassium chloride 10 meq once daily.  Hypothyroidism: On armour thyroid 120 mg daily.    Current Outpatient Medications on File Prior to Visit  Medication Sig Dispense Refill   atenolol (TENORMIN) 50 MG tablet TAKE 1 TABLET BY MOUTH EVERY DAY **ANNUAL APPT. DUE IN JANUARY MUST SEE PROVIDER FOR FUTURE REFILLS** 30 tablet 0   BIOTIN PO Take 2 tablets by mouth daily.      Cholecalciferol (VITAMIN D3) 1000 units CAPS Take 2 capsules (2,000 Units total) by mouth daily. 60 capsule 0   colchicine 0.6 MG tablet Take 1 tablet (0.6 mg total) by mouth daily. Overdue for Annual appt must see provider for future refills 30 tablet 0   EPIPEN 2-PAK 0.3 MG/0.3ML SOAJ injection      hydrochlorothiazide (MICROZIDE) 12.5 MG capsule Take 2 capsules (25 mg total) by mouth daily. Annual appt is due must see provider for future refills 60 capsule 0   hyoscyamine (LEVSIN SL) 0.125 MG SL tablet Place 1 tablet (0.125 mg total) under the tongue every 4 (four) hours as needed for up to 10 days. 60 tablet 3   losartan (COZAAR) 50 MG tablet Take 50 mg by mouth daily.     nabumetone (RELAFEN) 500 MG tablet Take 1 tablet (500 mg total) by mouth daily. Annual appt is due must see provider for future refills 30 tablet 2   potassium chloride (KLOR-CON) 10 MEQ tablet TAKE 1 TABLET BY MOUTH ONCE DAILY **ANNUAL APPT. DUE IN JANUARY MUST SEE PROVIDER FOR  FUTURE REFILLS** 30 tablet 0   thyroid (ARMOUR THYROID) 120 MG tablet TAKE 1 TABLET BY MOUTH ONCE DAILY BEFOREBREAKFAST 30 tablet 0   albuterol (PROVENTIL HFA;VENTOLIN HFA) 108 (90 Base) MCG/ACT inhaler Inhale 2 puffs into the lungs every 6 (six) hours as needed for wheezing or shortness of breath. (Patient not taking: Reported on 06/15/2021) 1 Inhaler 2   No current facility-administered medications on file prior to visit.   Past Medical History:  Diagnosis Date   Acute meniscal tear of knee sept 2012   right knee   Allergy    Arthritis of foot, right    first mtp   Asthma    Blood in stool    Cervical disc disease    s/p cervical spine surgury 1990   Chronic low back pain 07/15/2011   Chronic meniscal tear of knee 07/15/2011   Hyperlipidemia 07/15/2011   Hypertension    Hypothyroidism    IBS (irritable bowel syndrome) 07/03/2011   Impaired glucose tolerance 07/03/2011   Insomnia 07/15/2011   Lumbar disc disease    s/p lumbar fusion 1999   Migraine    Peripheral neuropathy 07/03/2011   Restrictive lung disease 07/15/2011   Past Surgical History:  Procedure Laterality Date   [place]  BREAST BIOPSY Left 2014   CARPAL TUNNEL RELEASE     x 2   CERVICAL LAMINECTOMY     CHOLECYSTECTOMY  1967   HEMORRHOID SURGERY     LUMBAR FUSION     TONSILLECTOMY  1967    Family History  Problem Relation Age of Onset   Breast cancer Mother    Hyperlipidemia Brother    Dementia Brother    Alcohol abuse Other    Cancer Other        breast cancer   Stroke Other    Hypertension Other    Mental illness Other    Arthritis Other    Alcohol abuse Other    Arthritis Other    Cancer Other        prostate cancer and breast cancer   Hyperlipidemia Other    Heart disease Other    Hypertension Other    Diabetes Other    Colon cancer Neg Hx    Social History   Socioeconomic History   Marital status: Married    Spouse name: Not on file   Number of  children: Not on file   Years of education: Not on file   Highest education level: Not on file  Occupational History   Not on file  Tobacco Use   Smoking status: Never   Smokeless tobacco: Never  Substance and Sexual Activity   Alcohol use: No    Alcohol/week: 0.0 standard drinks   Drug use: No   Sexual activity: Never  Other Topics Concern   Not on file  Social History Narrative   Live with spouse that is dementing   Social Determinants of Health   Financial Resource Strain: Not on file  Food Insecurity: Not on file  Transportation Needs: Not on file  Physical Activity: Not on file  Stress: Not on file  Social Connections: Not on file    Review of Systems  Constitutional:  Positive for fatigue. Negative for chills and fever.  HENT:  Negative for congestion, rhinorrhea and sore throat.   Respiratory:  Negative for cough and shortness of breath.   Cardiovascular:  Negative for chest pain.  Gastrointestinal:  Negative for abdominal pain, constipation, diarrhea, nausea and vomiting.  Endocrine: Positive for cold intolerance.  Genitourinary:  Negative for dysuria and urgency.  Musculoskeletal:  Positive for arthralgias (hands, knees, hips), back pain and myalgias.  Neurological:  Negative for dizziness, weakness, light-headedness and headaches.  Psychiatric/Behavioral:  Negative for dysphoric mood. The patient is not nervous/anxious.     Objective:  BP 140/70    Pulse 60    Temp (!) 97.1 F (36.2 C)    Resp 18    Ht 5\' 1"  (1.549 m)    Wt 200 lb 6.4 oz (90.9 kg)    SpO2 96%    BMI 37.87 kg/m   BP/Weight 06/15/2021 05/26/2018 [id number]  Systolic BP [id number] 123456 123456  Diastolic BP 70 92 123XX123  Wt. (Lbs) 200.4 205 201  BMI 37.87 35.74 35.05    Physical Exam Vitals reviewed.  Constitutional:      Appearance: Normal appearance. She is normal weight.  HENT:     Right Ear: Tympanic membrane normal.     Left Ear: Tympanic membrane normal.     Nose: Nose normal.     Mouth/Throat:      Pharynx: No oropharyngeal exudate or posterior oropharyngeal erythema.  Neck:     Vascular: No carotid bruit.  Cardiovascular:     Rate and Rhythm:  Normal rate and regular rhythm.     Heart sounds: Normal heart sounds.  Pulmonary:     Effort: Pulmonary effort is normal. No respiratory distress.     Breath sounds: Normal breath sounds.  Abdominal:     General: Abdomen is flat. Bowel sounds are normal.     Palpations: Abdomen is soft.     Tenderness: There is no abdominal tenderness.  Musculoskeletal:        General: Tenderness (right knee. increased warmth.) present.  Neurological:     Mental Status: She is alert and oriented to person, place, and time.  Psychiatric:        Mood and Affect: Mood normal.        Behavior: Behavior normal.    Diabetic Foot Exam - Simple   No data filed      Lab Results  Component Value Date   WBC 9.0 05/26/2018   HGB 14.7 05/26/2018   HCT 41.9 05/26/2018   PLT 189.0 05/26/2018   GLUCOSE 103 (H) 05/26/2018   CHOL 172 05/26/2018   TRIG 147.0 05/26/2018   HDL 41.00 05/26/2018   LDLCALC 102 (H) 05/26/2018   ALT 19 05/26/2018   AST 23 05/26/2018   NA 140 05/26/2018   K 4.2 05/26/2018   CL 103 05/26/2018   CREATININE 1.04 05/26/2018   BUN 24 (H) 05/26/2018   CO2 26 05/26/2018   TSH 1.53 05/26/2018   HGBA1C 5.5 05/26/2018      Assessment & Plan:   Problem List Items Addressed This Visit       Cardiovascular and Mediastinum   Hypertension    The current medical regimen is effective;  continue present plan and medications. Continue atenolol 50 mg daily, losartan 50 g daily, hctz 12.5 mg 2 daily.       Relevant Medications   losartan (COZAAR) 50 MG tablet     Endocrine   Hypothyroidism    The current medical regimen is effective;  continue present plan and medications. Continue Armour thyroid 120 mg qam.         Nervous and Auditory   Neuropathy    Check labs. Consider lyrica. Education given.       Relevant Orders    ANA w/Reflex (Completed)   B12 and Folate Panel (Completed)   Methylmalonic acid, serum (Completed)     Other   Pre-diabetes    Recommend continue to work on eating healthy diet and exercise.       Gout    Consider uloric 40 mg daily. Education given.  Continue colchicine.       Joint pain - Primary    The current medical regimen is effective;  continue present plan and medications.       Relevant Orders   CYCLIC CITRUL PEPTIDE ANTIBODY, IGG/IGA (Completed)   Sedimentation rate (Completed)   C-reactive protein (Completed)   Rheumatoid factor (Completed)   ANA w/Reflex (Completed)   B12 and Folate Panel (Completed)   Methylmalonic acid, serum (Completed)  .  No orders of the defined types were placed in this encounter.   Orders Placed This Encounter  Procedures   CYCLIC CITRUL PEPTIDE ANTIBODY, IGG/IGA   Sedimentation rate   C-reactive protein   Rheumatoid factor   ANA w/Reflex   B12 and Folate Panel   Methylmalonic acid, serum     Follow-up: Return in about 6 weeks (around 07/27/2021).  An After Visit Summary was printed and given to the patient.  Rochel Brome, MD [facility] [phone]

## 2021-06-18 LAB — C-REACTIVE PROTEIN: CRP: 2 mg/L (ref 0–10)

## 2021-06-18 LAB — CYCLIC CITRUL PEPTIDE ANTIBODY, IGG/IGA: Cyclic Citrullin Peptide Ab: 2 units (ref 0–19)

## 2021-06-18 LAB — METHYLMALONIC ACID, SERUM: Methylmalonic Acid: 1466 nmol/L — ABNORMAL HIGH (ref 0–378)

## 2021-06-18 LAB — SEDIMENTATION RATE: Sed Rate: 8 mm/hr (ref 0–40)

## 2021-06-18 LAB — ANA W/REFLEX: Anti Nuclear Antibody (ANA): NEGATIVE

## 2021-06-18 LAB — RHEUMATOID FACTOR: Rheumatoid fact SerPl-aCnc: <10 IU/mL (ref <14.0)

## 2021-06-18 LAB — B12 AND FOLATE PANEL
Folate: >20 ng/mL (ref >3.0)
Vitamin B-12: 315 pg/mL (ref 232–1245)

## 2021-06-19 ENCOUNTER — Encounter: Payer: Self-pay | Admitting: Family Medicine

## 2021-06-19 DIAGNOSIS — M2550 Pain in unspecified joint: Secondary | ICD-10-CM | POA: Insufficient documentation

## 2021-06-19 NOTE — Assessment & Plan Note (Signed)
The current medical regimen is effective;  continue present plan and medications.  

## 2021-06-19 NOTE — Assessment & Plan Note (Signed)
The current medical regimen is effective;  continue present plan and medications. Continue Armour thyroid 120 mg qam.

## 2021-06-19 NOTE — Assessment & Plan Note (Addendum)
Check labs. Consider lyrica. Education given.

## 2021-06-19 NOTE — Assessment & Plan Note (Signed)
Recommend continue to work on eating healthy diet and exercise.  

## 2021-06-19 NOTE — Assessment & Plan Note (Signed)
The current medical regimen is effective;  continue present plan and medications. Continue atenolol 50 mg daily, losartan 50 g daily, hctz 12.5 mg 2 daily.

## 2021-06-19 NOTE — Assessment & Plan Note (Signed)
Consider uloric 40 mg daily. Education given.  Continue colchicine.

## 2021-06-20 ENCOUNTER — Other Ambulatory Visit: Payer: Self-pay

## 2021-06-20 MED ORDER — CYANOCOBALAMIN 1000 MCG/ML IJ SOLN: INTRAMUSCULAR | 0 refills | Status: AC

## 2021-06-26 ENCOUNTER — Other Ambulatory Visit: Payer: Self-pay

## 2021-06-26 ENCOUNTER — Ambulatory Visit (INDEPENDENT_AMBULATORY_CARE_PROVIDER_SITE_OTHER): Payer: PPO

## 2021-06-26 DIAGNOSIS — E538 Deficiency of other specified B group vitamins: Secondary | ICD-10-CM | POA: Diagnosis not present

## 2021-06-26 MED ORDER — CYANOCOBALAMIN 1000 MCG/ML IJ SOLN
1000.0000 ug | Freq: Once | INTRAMUSCULAR | Status: AC
  Administered 2021-06-26: 1000 ug via INTRAMUSCULAR

## 2021-06-26 MED ORDER — "SYRINGE 25G X 1"" 3 ML MISC": 0 refills | Status: AC

## 2021-07-04 ENCOUNTER — Other Ambulatory Visit: Payer: Self-pay

## 2021-07-04 ENCOUNTER — Ambulatory Visit (INDEPENDENT_AMBULATORY_CARE_PROVIDER_SITE_OTHER): Payer: PPO

## 2021-07-04 DIAGNOSIS — E538 Deficiency of other specified B group vitamins: Secondary | ICD-10-CM

## 2021-07-04 MED ORDER — CYANOCOBALAMIN 1000 MCG/ML IJ SOLN
1000.0000 ug | Freq: Once | INTRAMUSCULAR | Status: AC
  Administered 2021-07-04: 1000 ug via INTRAMUSCULAR

## 2021-07-04 NOTE — Progress Notes (Signed)
Patient came in for b12 shot.

## 2021-07-11 ENCOUNTER — Ambulatory Visit (INDEPENDENT_AMBULATORY_CARE_PROVIDER_SITE_OTHER): Payer: PPO

## 2021-07-11 DIAGNOSIS — E538 Deficiency of other specified B group vitamins: Secondary | ICD-10-CM | POA: Diagnosis not present

## 2021-07-11 MED ORDER — CYANOCOBALAMIN 1000 MCG/ML IJ SOLN
1000.0000 ug | Freq: Once | INTRAMUSCULAR | Status: AC
  Administered 2021-07-11: 1000 ug via INTRAMUSCULAR

## 2021-07-19 ENCOUNTER — Ambulatory Visit (INDEPENDENT_AMBULATORY_CARE_PROVIDER_SITE_OTHER): Payer: PPO

## 2021-07-19 ENCOUNTER — Other Ambulatory Visit: Payer: Self-pay

## 2021-07-19 DIAGNOSIS — E538 Deficiency of other specified B group vitamins: Secondary | ICD-10-CM | POA: Diagnosis not present

## 2021-07-19 MED ORDER — CYANOCOBALAMIN 1000 MCG/ML IJ SOLN
1000.0000 ug | Freq: Once | INTRAMUSCULAR | Status: AC
  Administered 2021-07-19: 1000 ug via INTRAMUSCULAR

## 2021-07-19 NOTE — Progress Notes (Signed)
Patient was here for B12 injection. Patient tolerated injection well. ?

## 2021-07-31 ENCOUNTER — Ambulatory Visit: Payer: PPO | Admitting: Family Medicine

## 2022-02-19 ENCOUNTER — Other Ambulatory Visit: Payer: Self-pay | Admitting: Specialist

## 2022-02-23 ENCOUNTER — Other Ambulatory Visit: Payer: Self-pay | Admitting: Internal Medicine

## 2022-02-23 DIAGNOSIS — N644 Mastodynia: Secondary | ICD-10-CM

## 2022-03-27 ENCOUNTER — Other Ambulatory Visit: Payer: PPO

## 2022-04-04 ENCOUNTER — Telehealth: Payer: Self-pay

## 2022-04-04 NOTE — Telephone Encounter (Signed)
        Patient  visited [facility] on 03/17/2022  for not in Epic chart.   Telephone encounter attempt :  1st  A HIPAA compliant voice message was left requesting a return call.  Instructed patient to call back at [phone].   Jackob Crookston [facility]  [facility] Population Health Community Resource Care Guide   [email or web address]  ?? [phone]   Website: [email or web address]  [email or web address]

## 2022-04-05 ENCOUNTER — Telehealth: Payer: Self-pay

## 2022-04-05 NOTE — Telephone Encounter (Signed)
        Patient  visited [facility] on 03/17/2022  for not in Epic chart.   Telephone encounter attempt :  2nd  A HIPAA compliant voice message was left requesting a return call.  Instructed patient to call back at [phone].   Breniya Goertzen [facility]  [facility] Population Health Community Resource Care Guide   [email or web address]  ?? [phone]   Website: [email or web address]  [email or web address]

## 2022-04-06 ENCOUNTER — Telehealth: Payer: Self-pay

## 2022-04-06 NOTE — Telephone Encounter (Signed)
     Patient  visit on 03/17/2022  at [facility] was for swollen ankles.  Have you been able to follow up with your primary care physician? Yes  The patient was or was not able to obtain any needed medicine or equipment. Patient stated that her medication was not called in to her pharmacy. She stated that she would follow up with the hospital.   Are there diet recommendations that you are having difficulty following? No  Patient expresses understanding of discharge instructions and education provided has no other needs at this time.    Archana Eckman [facility]  [facility] Population Health Community Resource Care Guide   [email or web address]  ?? [phone]   Website: [email or web address]  [email or web address]

## 2022-05-18 ENCOUNTER — Ambulatory Visit
Admission: RE | Admit: 2022-05-18 | Discharge: 2022-05-18 | Disposition: A | Discharge status: Home / Self Care | Payer: PPO | Source: Ambulatory Visit | Attending: Internal Medicine | Admitting: Internal Medicine

## 2022-05-18 ENCOUNTER — Ambulatory Visit: Payer: PPO

## 2022-05-18 DIAGNOSIS — N644 Mastodynia: Secondary | ICD-10-CM

## 2022-07-03 ENCOUNTER — Telehealth: Payer: Self-pay

## 2022-07-03 NOTE — Telephone Encounter (Signed)
Called patient to schedule Medicare Annual Wellness Visit (AWV). Left message for patient to call back and schedule Medicare Annual Wellness Visit (AWV).  Last date of AWV: 12/28/14  Please schedule an appointment at any time with Shelle Iron, LPN.  Norton Blizzard, [place] ([facility]AMA)  [place] [facility] [phone]

## 2022-09-05 ENCOUNTER — Encounter: Payer: Self-pay | Admitting: Internal Medicine

## 2022-10-17 ENCOUNTER — Telehealth: Payer: Self-pay

## 2022-11-13 ENCOUNTER — Telehealth: Payer: Self-pay

## 2022-11-13 NOTE — Telephone Encounter (Signed)
Called patient to schedule Medicare Annual Wellness Visit (AWV). Left message for patient to call back and schedule Medicare Annual Wellness Visit (AWV).    Please schedule an appointment at with Dr. Faylene Kurtz

## 2023-05-13 DIAGNOSIS — M1A9XX1 Chronic gout, unspecified, with tophus (tophi): Secondary | ICD-10-CM | POA: Diagnosis not present

## 2023-05-13 DIAGNOSIS — M549 Dorsalgia, unspecified: Secondary | ICD-10-CM | POA: Diagnosis not present

## 2023-05-13 DIAGNOSIS — M7989 Other specified soft tissue disorders: Secondary | ICD-10-CM | POA: Diagnosis not present

## 2023-05-13 DIAGNOSIS — N1831 Chronic kidney disease, stage 3a: Secondary | ICD-10-CM | POA: Diagnosis not present

## 2023-05-13 DIAGNOSIS — M79643 Pain in unspecified hand: Secondary | ICD-10-CM | POA: Diagnosis not present

## 2023-05-13 DIAGNOSIS — Z79899 Other long term (current) drug therapy: Secondary | ICD-10-CM | POA: Diagnosis not present

## 2023-05-13 DIAGNOSIS — M25561 Pain in right knee: Secondary | ICD-10-CM | POA: Diagnosis not present

## 2023-05-13 DIAGNOSIS — M109 Gout, unspecified: Secondary | ICD-10-CM | POA: Diagnosis not present

## 2023-05-13 DIAGNOSIS — M25512 Pain in left shoulder: Secondary | ICD-10-CM | POA: Diagnosis not present

## 2023-05-13 DIAGNOSIS — R768 Other specified abnormal immunological findings in serum: Secondary | ICD-10-CM | POA: Diagnosis not present

## 2023-05-13 DIAGNOSIS — M1990 Unspecified osteoarthritis, unspecified site: Secondary | ICD-10-CM | POA: Diagnosis not present

## 2023-05-15 DIAGNOSIS — I10 Essential (primary) hypertension: Secondary | ICD-10-CM | POA: Diagnosis not present

## 2023-05-15 DIAGNOSIS — M109 Gout, unspecified: Secondary | ICD-10-CM | POA: Diagnosis not present

## 2023-05-15 DIAGNOSIS — M159 Polyosteoarthritis, unspecified: Secondary | ICD-10-CM | POA: Diagnosis not present

## 2023-05-15 DIAGNOSIS — M25572 Pain in left ankle and joints of left foot: Secondary | ICD-10-CM | POA: Diagnosis not present

## 2023-05-15 DIAGNOSIS — E039 Hypothyroidism, unspecified: Secondary | ICD-10-CM | POA: Diagnosis not present

## 2023-05-15 DIAGNOSIS — M069 Rheumatoid arthritis, unspecified: Secondary | ICD-10-CM | POA: Diagnosis not present

## 2023-05-16 DIAGNOSIS — M1A9XX1 Chronic gout, unspecified, with tophus (tophi): Secondary | ICD-10-CM | POA: Diagnosis not present

## 2023-05-16 DIAGNOSIS — Z79899 Other long term (current) drug therapy: Secondary | ICD-10-CM | POA: Diagnosis not present

## 2023-06-05 DIAGNOSIS — M25571 Pain in right ankle and joints of right foot: Secondary | ICD-10-CM | POA: Diagnosis not present

## 2023-06-05 DIAGNOSIS — I10 Essential (primary) hypertension: Secondary | ICD-10-CM | POA: Diagnosis not present

## 2023-06-05 DIAGNOSIS — M25471 Effusion, right ankle: Secondary | ICD-10-CM | POA: Diagnosis not present

## 2023-06-05 DIAGNOSIS — M19071 Primary osteoarthritis, right ankle and foot: Secondary | ICD-10-CM | POA: Diagnosis not present

## 2023-06-06 DIAGNOSIS — M25571 Pain in right ankle and joints of right foot: Secondary | ICD-10-CM | POA: Diagnosis not present

## 2023-07-11 DIAGNOSIS — N1831 Chronic kidney disease, stage 3a: Secondary | ICD-10-CM | POA: Diagnosis not present

## 2023-07-11 DIAGNOSIS — M7989 Other specified soft tissue disorders: Secondary | ICD-10-CM | POA: Diagnosis not present

## 2023-07-11 DIAGNOSIS — M25571 Pain in right ankle and joints of right foot: Secondary | ICD-10-CM | POA: Diagnosis not present

## 2023-07-11 DIAGNOSIS — M1A9XX1 Chronic gout, unspecified, with tophus (tophi): Secondary | ICD-10-CM | POA: Diagnosis not present

## 2023-07-11 DIAGNOSIS — M79643 Pain in unspecified hand: Secondary | ICD-10-CM | POA: Diagnosis not present

## 2023-07-11 DIAGNOSIS — Z79899 Other long term (current) drug therapy: Secondary | ICD-10-CM | POA: Diagnosis not present

## 2023-07-11 DIAGNOSIS — M109 Gout, unspecified: Secondary | ICD-10-CM | POA: Diagnosis not present

## 2023-07-11 DIAGNOSIS — R768 Other specified abnormal immunological findings in serum: Secondary | ICD-10-CM | POA: Diagnosis not present

## 2023-07-11 DIAGNOSIS — M25471 Effusion, right ankle: Secondary | ICD-10-CM | POA: Diagnosis not present

## 2023-07-11 DIAGNOSIS — M1990 Unspecified osteoarthritis, unspecified site: Secondary | ICD-10-CM | POA: Diagnosis not present

## 2023-12-13 DIAGNOSIS — E860 Dehydration: Secondary | ICD-10-CM | POA: Diagnosis not present

## 2023-12-13 DIAGNOSIS — N179 Acute kidney failure, unspecified: Secondary | ICD-10-CM | POA: Diagnosis not present

## 2023-12-13 DIAGNOSIS — M069 Rheumatoid arthritis, unspecified: Secondary | ICD-10-CM | POA: Diagnosis not present

## 2023-12-13 DIAGNOSIS — M25561 Pain in right knee: Secondary | ICD-10-CM | POA: Diagnosis not present

## 2023-12-13 DIAGNOSIS — M5136 Other intervertebral disc degeneration, lumbar region with discogenic back pain only: Secondary | ICD-10-CM | POA: Diagnosis not present

## 2023-12-13 DIAGNOSIS — I444 Left anterior fascicular block: Secondary | ICD-10-CM | POA: Diagnosis not present

## 2023-12-13 DIAGNOSIS — I10 Essential (primary) hypertension: Secondary | ICD-10-CM | POA: Diagnosis not present

## 2023-12-13 DIAGNOSIS — M25562 Pain in left knee: Secondary | ICD-10-CM | POA: Diagnosis not present

## 2023-12-13 DIAGNOSIS — R9431 Abnormal electrocardiogram [ECG] [EKG]: Secondary | ICD-10-CM | POA: Diagnosis not present

## 2023-12-13 DIAGNOSIS — G629 Polyneuropathy, unspecified: Secondary | ICD-10-CM | POA: Diagnosis not present

## 2023-12-13 DIAGNOSIS — E7800 Pure hypercholesterolemia, unspecified: Secondary | ICD-10-CM | POA: Diagnosis not present

## 2023-12-13 DIAGNOSIS — T796XXA Traumatic ischemia of muscle, initial encounter: Secondary | ICD-10-CM | POA: Diagnosis not present

## 2023-12-13 DIAGNOSIS — I440 Atrioventricular block, first degree: Secondary | ICD-10-CM | POA: Diagnosis not present

## 2023-12-13 DIAGNOSIS — R55 Syncope and collapse: Secondary | ICD-10-CM | POA: Diagnosis not present

## 2023-12-14 DIAGNOSIS — N179 Acute kidney failure, unspecified: Secondary | ICD-10-CM | POA: Diagnosis not present

## 2023-12-14 DIAGNOSIS — R55 Syncope and collapse: Secondary | ICD-10-CM | POA: Diagnosis not present

## 2023-12-24 DIAGNOSIS — G629 Polyneuropathy, unspecified: Secondary | ICD-10-CM | POA: Diagnosis not present

## 2023-12-24 DIAGNOSIS — R55 Syncope and collapse: Secondary | ICD-10-CM | POA: Diagnosis not present

## 2023-12-24 DIAGNOSIS — M150 Primary generalized (osteo)arthritis: Secondary | ICD-10-CM | POA: Diagnosis not present

## 2023-12-24 DIAGNOSIS — D61818 Other pancytopenia: Secondary | ICD-10-CM | POA: Diagnosis not present

## 2023-12-24 DIAGNOSIS — N1831 Chronic kidney disease, stage 3a: Secondary | ICD-10-CM | POA: Diagnosis not present

## 2024-01-10 DIAGNOSIS — R262 Difficulty in walking, not elsewhere classified: Secondary | ICD-10-CM | POA: Diagnosis not present

## 2024-01-10 DIAGNOSIS — R55 Syncope and collapse: Secondary | ICD-10-CM | POA: Diagnosis not present

## 2024-01-10 DIAGNOSIS — G8929 Other chronic pain: Secondary | ICD-10-CM | POA: Diagnosis not present

## 2024-01-10 DIAGNOSIS — G629 Polyneuropathy, unspecified: Secondary | ICD-10-CM | POA: Diagnosis not present

## 2024-03-21 ENCOUNTER — Encounter (HOSPITAL_BASED_OUTPATIENT_CLINIC_OR_DEPARTMENT_OTHER): Payer: Self-pay | Admitting: Family Medicine

## 2024-03-21 ENCOUNTER — Ambulatory Visit (HOSPITAL_BASED_OUTPATIENT_CLINIC_OR_DEPARTMENT_OTHER)
Admission: EM | Admit: 2024-03-21 | Discharge: 2024-03-21 | Disposition: A | Discharge status: Home / Self Care | Attending: Family Medicine | Admitting: Family Medicine

## 2024-03-21 DIAGNOSIS — M62830 Muscle spasm of back: Secondary | ICD-10-CM

## 2024-03-21 DIAGNOSIS — M5441 Lumbago with sciatica, right side: Secondary | ICD-10-CM | POA: Diagnosis not present

## 2024-03-21 DIAGNOSIS — M549 Dorsalgia, unspecified: Secondary | ICD-10-CM

## 2024-03-21 HISTORY — DX: Polyneuropathy, unspecified: G62.9

## 2024-03-21 MED ORDER — CYCLOBENZAPRINE HCL 5 MG PO TABS: 5.0000 mg | ORAL_TABLET | Freq: Every evening | ORAL | 0 refills | Status: AC | PRN

## 2024-03-21 MED ORDER — METHYLPREDNISOLONE ACETATE 80 MG/ML IJ SUSP
80.0000 mg | Freq: Once | INTRAMUSCULAR | Status: AC
  Administered 2024-03-21: 80 mg via INTRAMUSCULAR

## 2024-03-21 NOTE — Discharge Instructions (Addendum)
 Upper, mid, lower back pain and muscle spasms: Depo-Medrol, 80 mg injection now (this is a steroid injection).  Cyclobenzaprine , 5 mg, take after evening meal.  Do not use and drive.  Cyclobenzaprine  could make you drowsy.  Cyclobenzaprine  is for muscle spasms.  Do not use Advil, Motrin, Ibuprofen, Aleve, Naproxen Sodium (NSAIDS) for 2 weeks after a steroid injection.  May use Tylenol /Acetaminophen , 500 mg,1-1.5 pills, every 6 hours as needed for pain.   Follow-up with primary care or return here if symptoms do not improve, worsen or new symptoms occur.  May need to see primary care and get a referral for physical therapy or even to see an orthopedist.

## 2024-03-21 NOTE — ED Triage Notes (Signed)
 Pt c/o right sided back pain that radiates down her leg she has had this on and off but the pain has gotten worse recently, pt has neuropathy and arthritis.

## 2024-03-21 NOTE — ED Provider Notes (Signed)
 [facility]    CSN: [id number] Arrival date & time: 03/21/24  1003      History   Chief Complaint Chief Complaint  Patient presents with   Back Pain    HPI Sheryl Copeland is a 78 y.o. female.   78 year old female who has had right sided lower back pain and gluteal muscle pain that shoots down her right leg.  This has been present since 03/07/2024 or earlier.  It is intermittent and sometimes it is mild and other times it is quite a bit worse.  She does have arthritis and some neuropathy.  The pain has been significantly worse since 03/19/2024.  She is not aware of any injury nor trauma.  She is not having any problem with bowel or bladder control.  She does not have any numbness of her tailbone or lower buttocks.  She is not having any weakness of her legs.     Past Medical History:  Diagnosis Date   Acute meniscal tear of knee 01/06/2011   right knee   Allergy    Arthritis of foot, right    first mtp   Asthma    Blood in stool    Cervical disc disease    s/p cervical spine surgury 1990   Chronic low back pain 07/15/2011   Chronic meniscal tear of knee 07/15/2011   Hyperlipidemia 07/15/2011   Hypertension    Hypothyroidism    IBS (irritable bowel syndrome) 07/03/2011   Impaired glucose tolerance 07/03/2011   Insomnia 07/15/2011   Lumbar disc disease    s/p lumbar fusion 1999   Migraine    Neuropathy    Peripheral neuropathy 07/03/2011   Restrictive lung disease 07/15/2011    Patient Active Problem List   Diagnosis Date Noted   Joint pain 06/19/2021   Stage 3a chronic kidney disease (HCC) 07/22/2019   Primary osteoarthritis involving multiple joints 07/22/2019   Class 2 severe obesity due to excess calories with serious comorbidity and body mass index (BMI) of 35.0 to 35.9 in adult 07/22/2019   Back pain 01/12/2014   Gout 06/12/2013   Right knee pain 06/12/2013   Idiopathic chronic gout of multiple sites without tophus 06/12/2013   Chronic  meniscal tear of knee 07/15/2011   Insomnia 07/15/2011   Chronic low back pain 07/15/2011   Hyperlipidemia 07/15/2011   Migraine    IBS (irritable bowel syndrome) 07/03/2011   Neuropathy 07/03/2011   Pre-diabetes 07/03/2011   Open wound of left great toe 07/03/2011   Cervical disc disease    Lumbar disc disease    Asthma, intermittent    Hypertension    Hypothyroidism    Encounter for well adult exam with abnormal findings 06/30/2011    Past Surgical History:  Procedure Laterality Date   ABDOMINAL HYSTERECTOMY  1974   APPENDECTOMY  1962   BREAST BIOPSY Left 2014   CARPAL TUNNEL RELEASE     x 2   CERVICAL LAMINECTOMY     CHOLECYSTECTOMY  1967   HEMORRHOID SURGERY     LUMBAR FUSION     TONSILLECTOMY  1967    OB History   No obstetric history on file.      Home Medications    Prior to Admission medications   Medication Sig Start Date End Date Taking? Authorizing Provider  atenolol  (TENORMIN ) 50 MG tablet TAKE 1 TABLET BY MOUTH EVERY DAY **ANNUAL APPT. DUE IN JANUARY MUST SEE PROVIDER FOR FUTURE REFILLS** 07/06/19  Yes Norleen Lynwood ORN, MD  colchicine  0.6 MG tablet Take 1 tablet (0.6 mg total) by mouth daily. Overdue for Annual appt must see provider for future refills 07/15/19  Yes Norleen Lynwood ORN, MD  cyclobenzaprine  (FLEXERIL ) 5 MG tablet Take 1 tablet (5 mg total) by mouth at bedtime as needed for up to 15 days for muscle spasms. 03/21/24 04/05/24 Yes Ival Domino, FNP  hydrochlorothiazide  (MICROZIDE ) 12.5 MG capsule Take 2 capsules (25 mg total) by mouth daily. Annual appt is due must see provider for future refills 07/06/19  Yes Norleen Lynwood ORN, MD  losartan (COZAAR) 50 MG tablet Take 50 mg by mouth daily.   Yes [provider]  thyroid  (ARMOUR THYROID ) 120 MG tablet TAKE 1 TABLET BY MOUTH ONCE DAILY BEFOREBREAKFAST 07/06/19  Yes Norleen Lynwood ORN, MD  albuterol  (PROVENTIL  HFA;VENTOLIN  HFA) 108 (90 Base) MCG/ACT inhaler Inhale 2 puffs into the lungs every 6 (six) hours as  needed for wheezing or shortness of breath. Patient not taking: Reported on 06/15/2021 05/25/16   Norleen Lynwood ORN, MD  BIOTIN PO Take 2 tablets by mouth daily.     [provider]  Cholecalciferol (VITAMIN D3) 1000 units CAPS Take 2 capsules (2,000 Units total) by mouth daily. 06/16/15   Norleen Lynwood ORN, MD  cyanocobalamin  (,VITAMIN B-12,) 1000 MCG/ML injection Once weekly for four weeks; then once every 30 days. 06/20/21   Sherre Clapper, MD  EPIPEN 2-PAK 0.3 MG/0.3ML SOAJ injection  01/01/15   [provider]  hyoscyamine  (LEVSIN  SL) 0.125 MG SL tablet Place 1 tablet (0.125 mg total) under the tongue every 4 (four) hours as needed for up to 10 days. 05/26/18 06/15/21  Norleen Lynwood ORN, MD  nabumetone  (RELAFEN ) 500 MG tablet Take 1 tablet (500 mg total) by mouth daily. Annual appt is due must see provider for future refills 06/02/19   Norleen Lynwood ORN, MD  potassium chloride  (KLOR-CON ) 10 MEQ tablet TAKE 1 TABLET BY MOUTH ONCE DAILY **ANNUAL APPT. DUE IN JANUARY MUST SEE PROVIDER FOR FUTURE REFILLS** 07/06/19   Norleen Lynwood ORN, MD  Syringe/Needle, Disp, (SYRINGE 3CC/25GX1) 25G X 1 3 ML MISC Use as instructed. 06/26/21   CoxClapper, MD    Family History Family History  Problem Relation Age of Onset   Breast cancer Mother    Hyperlipidemia Brother    Dementia Brother    Alcohol abuse Other    Cancer Other        breast cancer   Stroke Other    Hypertension Other    Mental illness Other    Arthritis Other    Alcohol abuse Other    Arthritis Other    Cancer Other        prostate cancer and breast cancer   Hyperlipidemia Other    Heart disease Other    Hypertension Other    Diabetes Other    Colon cancer Neg Hx     Social History Social History   Tobacco Use   Smoking status: Never   Smokeless tobacco: Never  Substance Use Topics   Alcohol use: No    Alcohol/week: 0.0 standard drinks of alcohol   Drug use: No     Allergies   Allopurinol , Aspirin, Celebrex [celecoxib],  Dipyridamole, Egg protein-containing drug products, Erythromycin, Gabapentin , Iodine, and Penicillins   Review of Systems Review of Systems  Constitutional:  Negative for chills and fever.  HENT:  Negative for ear pain and sore throat.   Eyes:  Negative for pain and visual disturbance.  Respiratory:  Negative for cough and shortness of breath.   Cardiovascular:  Negative for chest pain and palpitations.  Gastrointestinal:  Negative for abdominal pain, constipation, diarrhea, nausea and vomiting.  Genitourinary:  Negative for dysuria and hematuria.  Musculoskeletal:  Positive for back pain. Negative for arthralgias.  Skin:  Negative for color change and rash.  Neurological:  Negative for seizures and syncope.  All other systems reviewed and are negative.    Physical Exam Triage Vital Signs ED Triage Vitals [03/21/24 1310]  Encounter Vitals Group     BP      Girls Systolic BP Percentile      Girls Diastolic BP Percentile      Boys Systolic BP Percentile      Boys Diastolic BP Percentile      Pulse      Resp      Temp      Temp src      SpO2      Weight      Height      Head Circumference      Peak Flow      Pain Score 8     Pain Loc      Pain Education      Exclude from Growth Chart    No data found.  Updated Vital Signs BP 132/82 (BP Location: Right Arm)   Pulse 83   Temp 98 F (36.7 C) (Oral)   Resp 18   SpO2 94%   Visual Acuity Right Eye Distance:   Left Eye Distance:   Bilateral Distance:    Right Eye Near:   Left Eye Near:    Bilateral Near:     Physical Exam Vitals and nursing note reviewed.  Constitutional:      General: She is not in acute distress.    Appearance: She is well-developed. She is not ill-appearing or toxic-appearing.  HENT:     Head: Normocephalic and atraumatic.     Right Ear: Hearing, tympanic membrane, ear canal and external ear normal.     Left Ear: Hearing, tympanic membrane, ear canal and external ear normal.     Nose: No  congestion or rhinorrhea.     Right Sinus: No maxillary sinus tenderness or frontal sinus tenderness.     Left Sinus: No maxillary sinus tenderness or frontal sinus tenderness.     Mouth/Throat:     Lips: Pink.     Mouth: Mucous membranes are moist.     Pharynx: Uvula midline. No oropharyngeal exudate or posterior oropharyngeal erythema.     Tonsils: No tonsillar exudate.  Eyes:     Conjunctiva/sclera: Conjunctivae normal.     Pupils: Pupils are equal, round, and reactive to light.  Cardiovascular:     Rate and Rhythm: Normal rate and regular rhythm.     Heart sounds: S1 normal and S2 normal. No murmur heard. Pulmonary:     Effort: Pulmonary effort is normal. No respiratory distress.     Breath sounds: Normal breath sounds. No decreased breath sounds, wheezing, rhonchi or rales.  Abdominal:     General: Bowel sounds are normal.     Palpations: Abdomen is soft.     Tenderness: There is no abdominal tenderness.  Musculoskeletal:        General: No swelling.     Cervical back: Neck supple. Tenderness (Along the cervical spine and upper back centrally.) and bony tenderness (Mild) present. No swelling, edema, deformity, erythema, signs of trauma, lacerations, rigidity, spasms, torticollis or crepitus.  Pain with movement present. Normal range of motion.     Thoracic back: Tenderness (Along the thoracic spine and mid back centrally) and bony tenderness (Along the spinal column bilaterally) present. No swelling, edema, deformity, signs of trauma, lacerations or spasms. Normal range of motion. No scoliosis.     Lumbar back: Spasms (Lower back centrally and radiating to both hips, R>L), tenderness (Lower back centrally and radiating to both hips, R>L) and bony tenderness (Along the lumbar spine centrally.) present. No swelling, edema, deformity, signs of trauma or lacerations. Normal range of motion. No scoliosis.  Lymphadenopathy:     Head:     Right side of head: No submental, submandibular,  tonsillar, preauricular or posterior auricular adenopathy.     Left side of head: No submental, submandibular, tonsillar, preauricular or posterior auricular adenopathy.     Cervical: No cervical adenopathy.     Right cervical: No superficial cervical adenopathy.    Left cervical: No superficial cervical adenopathy.  Skin:    General: Skin is warm and dry.     Capillary Refill: Capillary refill takes less than 2 seconds.     Findings: No rash.  Neurological:     Mental Status: She is alert and oriented to person, place, and time.     Gait: Gait abnormal (Minimally unsteady on her feet and uses a cane on the right.).     Deep Tendon Reflexes: Reflexes are normal and symmetric.  Psychiatric:        Mood and Affect: Mood normal.      UC Treatments / Results  Labs (all labs ordered are listed, but only abnormal results are displayed) Comprehensive Metabolic Panel: 03/21/24: Order: [id number] Component Ref Range & Units 2 mo ago  Sodium 136 - 145 mmol/L 136  Potassium 3.5 - 5.1 mmol/L 4.3  Comment: NO VISIBLE HEMOLYSIS  Chloride 98 - 107 mmol/L 99  CO2 21 - 31 mmol/L 26  Anion Gap 6 - 14 mmol/L 11  Glucose, Random 70 - 99 mg/dL 845 High   Blood Urea Nitrogen (BUN) 7 - 25 mg/dL 26 High   Creatinine 9.39 - 1.20 mg/dL 8.86  eGFR >40 fO/fpw/8.26f7 49 Low   Comment: GFR estimated by CKD-EPI equations(NKF 2021).  Recommend confirmation of Cr-based eGFR by using Cys-based eGFR and other filtration markers (if applicable) in complex cases and clinical decision-making, as needed.  Albumin 3.5 - 5.7 g/dL 4.2  Total Protein 6.4 - 8.9 g/dL 6.9  Bilirubin, Total 0.3 - 1.0 mg/dL 0.7  Alkaline Phosphatase (ALP) 34 - 104 U/L 39  Aspartate Aminotransferase (AST) 13 - 39 U/L 26  Alanine Aminotransferase (ALT) 7 - 52 U/L 32  Calcium 8.6 - 10.3 mg/dL 9.7  BUN/Creatinine Ratio   Comment: Creatinine is normal, ratio is not clinically indicated.  Resulting Agency [facility](CLIA# [id number])    EKG   Radiology No results found.  Procedures Procedures (including critical care time)  Medications Ordered in UC Medications  methylPREDNISolone acetate (DEPO-MEDROL) injection 80 mg (80 mg Intramuscular Given 03/21/24 1341)    Initial Impression / Assessment and Plan / UC Course  I have reviewed the triage vital signs and the nursing notes.  Pertinent labs & imaging results that were available during my care of the patient were reviewed by me and considered in my medical decision making (see chart for details).   Plan of Care: Upper, mid, lower back pain and muscle spasms: Depo-Medrol, 80 mg injection now (this is a  steroid injection).  Cyclobenzaprine , 5 mg, take after evening meal.  Do not use and drive.  Cyclobenzaprine  could make you drowsy.  Cyclobenzaprine  is for muscle spasms.  Do not use Advil, Motrin, Ibuprofen, Aleve, Naproxen Sodium (NSAIDS) for 2 weeks after a steroid injection.  May use Tylenol /Acetaminophen , 500 mg,1-1.5 pills, every 6 hours as needed for pain.   Follow-up with primary care or return here if symptoms do not improve, worsen or new symptoms occur.  May need to see primary care and get a referral for physical therapy or even to see an orthopedist.  I reviewed the plan of care with the patient and/or the patient's guardian.  The patient and/or guardian had time to ask questions and acknowledged that the questions were answered.  I provided instruction on symptoms or reasons to return here or to go to an ER, if symptoms/condition did not improve, worsened or if new symptoms occurred.  Final Clinical Impressions(s) / UC Diagnoses   Final diagnoses:  Acute bilateral low back pain with right-sided sciatica  Muscle spasm of back  Upper back pain  Acute mid back pain     Discharge Instructions      Upper, mid, lower back pain and muscle spasms: Depo-Medrol, 80 mg injection now (this is a steroid  injection).  Cyclobenzaprine , 5 mg, take after evening meal.  Do not use and drive.  Cyclobenzaprine  could make you drowsy.  Cyclobenzaprine  is for muscle spasms.  Do not use Advil, Motrin, Ibuprofen, Aleve, Naproxen Sodium (NSAIDS) for 2 weeks after a steroid injection.  May use Tylenol /Acetaminophen , 500 mg,1-1.5 pills, every 6 hours as needed for pain.   Follow-up with primary care or return here if symptoms do not improve, worsen or new symptoms occur.  May need to see primary care and get a referral for physical therapy or even to see an orthopedist.     ED Prescriptions     Medication Sig Dispense Auth. Provider   cyclobenzaprine  (FLEXERIL ) 5 MG tablet Take 1 tablet (5 mg total) by mouth at bedtime as needed for up to 15 days for muscle spasms. 15 tablet Ryian Lynde, FNP      PDMP not reviewed this encounter.   Ival Domino, FNP 03/21/24 [phone]
